# Patient Record
Sex: Female | Born: 1980 | Hispanic: No | Marital: Single | Smoking: Current every day smoker
Health system: Southern US, Community
[De-identification: ages and names within clinical notes are randomized; demographics above are authoritative.]

## PROBLEM LIST (undated history)

## (undated) DIAGNOSIS — K219 Gastro-esophageal reflux disease without esophagitis: Secondary | ICD-10-CM

## (undated) DIAGNOSIS — F329 Major depressive disorder, single episode, unspecified: Secondary | ICD-10-CM

## (undated) DIAGNOSIS — R51 Headache: Secondary | ICD-10-CM

## (undated) DIAGNOSIS — K279 Peptic ulcer, site unspecified, unspecified as acute or chronic, without hemorrhage or perforation: Secondary | ICD-10-CM

## (undated) DIAGNOSIS — F32A Depression, unspecified: Secondary | ICD-10-CM

## (undated) HISTORY — DX: Peptic ulcer, site unspecified, unspecified as acute or chronic, without hemorrhage or perforation: K27.9

## (undated) HISTORY — DX: Major depressive disorder, single episode, unspecified: F32.9

## (undated) HISTORY — DX: Gastro-esophageal reflux disease without esophagitis: K21.9

## (undated) HISTORY — DX: Headache: R51

## (undated) HISTORY — DX: Depression, unspecified: F32.A

---
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
## 2008-04-19 ENCOUNTER — Other Ambulatory Visit: Admission: RE | Admit: 2008-04-19 | Discharge: 2008-04-19 | Payer: Self-pay | Admitting: Gynecology

## 2009-03-19 ENCOUNTER — Emergency Department (HOSPITAL_COMMUNITY): Admission: EM | Admit: 2009-03-19 | Discharge: 2009-03-19 | Payer: Self-pay | Admitting: Family Medicine

## 2009-03-21 ENCOUNTER — Ambulatory Visit: Payer: Self-pay | Admitting: Internal Medicine

## 2009-03-21 DIAGNOSIS — F329 Major depressive disorder, single episode, unspecified: Secondary | ICD-10-CM

## 2009-03-21 DIAGNOSIS — R519 Headache, unspecified: Secondary | ICD-10-CM | POA: Insufficient documentation

## 2009-03-21 DIAGNOSIS — K279 Peptic ulcer, site unspecified, unspecified as acute or chronic, without hemorrhage or perforation: Secondary | ICD-10-CM | POA: Insufficient documentation

## 2009-03-21 DIAGNOSIS — F17200 Nicotine dependence, unspecified, uncomplicated: Secondary | ICD-10-CM | POA: Insufficient documentation

## 2009-03-21 DIAGNOSIS — K219 Gastro-esophageal reflux disease without esophagitis: Secondary | ICD-10-CM

## 2009-03-21 DIAGNOSIS — R51 Headache: Secondary | ICD-10-CM

## 2009-05-07 ENCOUNTER — Telehealth (INDEPENDENT_AMBULATORY_CARE_PROVIDER_SITE_OTHER): Payer: Self-pay | Admitting: *Deleted

## 2009-05-08 ENCOUNTER — Encounter: Payer: Self-pay | Admitting: Internal Medicine

## 2009-05-29 ENCOUNTER — Ambulatory Visit: Payer: Self-pay | Admitting: Internal Medicine

## 2009-06-20 ENCOUNTER — Ambulatory Visit: Payer: Self-pay | Admitting: Internal Medicine

## 2009-09-26 ENCOUNTER — Telehealth: Payer: Self-pay | Admitting: Internal Medicine

## 2009-10-24 ENCOUNTER — Ambulatory Visit: Payer: Self-pay | Admitting: Internal Medicine

## 2009-11-22 ENCOUNTER — Emergency Department (HOSPITAL_COMMUNITY): Admission: EM | Admit: 2009-11-22 | Discharge: 2009-11-22 | Payer: Self-pay | Admitting: Emergency Medicine

## 2009-12-26 ENCOUNTER — Ambulatory Visit: Payer: Self-pay | Admitting: Internal Medicine

## 2009-12-31 ENCOUNTER — Telehealth: Payer: Self-pay | Admitting: Internal Medicine

## 2010-01-01 ENCOUNTER — Ambulatory Visit: Payer: Self-pay | Admitting: Internal Medicine

## 2010-01-02 ENCOUNTER — Telehealth: Payer: Self-pay | Admitting: Internal Medicine

## 2010-01-02 ENCOUNTER — Encounter: Payer: Self-pay | Admitting: Internal Medicine

## 2010-03-12 ENCOUNTER — Telehealth: Payer: Self-pay | Admitting: Internal Medicine

## 2010-04-30 NOTE — Progress Notes (Signed)
Summary: samples.result  Phone Note Outgoing Call Call back at [phone]   Call placed by: Rock Nephew CMA,  January 02, 2010 10:05 AM Summary of Call: LA: please tell her that her breathing was positive for mild asthma. ask her to come in and pick up samples of advair diskus Initial call taken by: Etta Grandchild MD,  January 02, 2010 9:18 AM  Follow-up for Phone Call        Called pt/ left detailed messg on [phone] (ok per consent). Samples placed upfront for pick up.Alvy Beal Archie CMA  January 02, 2010 10:06 AM     New/Updated Medications: ADVAIR DISKUS 100-50 MCG/DOSE AEPB (FLUTICASONE-SALMETEROL) One puff two times a day Prescriptions: ADVAIR DISKUS 100-50 MCG/DOSE AEPB (FLUTICASONE-SALMETEROL) One puff two times a day  #4 inhs x 0   Entered and Authorized by:   Etta Grandchild MD   Signed by:   Etta Grandchild MD on 01/02/2010   Method used:   Samples Given   RxID:   [id number]

## 2010-04-30 NOTE — Miscellaneous (Signed)
Summary: [facility]  [facility]   Imported By: Sherian Rein 05/09/2009 12:30:20  _____________________________________________________________________  External Attachment:    Type:   Image     Comment:   External Document

## 2010-04-30 NOTE — Assessment & Plan Note (Signed)
Summary: FOLLOW UP ON SHORTNESS OF BREATH-LB   Vital Signs:  Patient profile:   30 year old female Menstrual status:  regular Height:      64 inches Weight:      150 pounds BMI:     25.84 O2 Sat:      97 % on Room air Temp:     98.5 degrees F oral Pulse rate:   87 / minute Pulse rhythm:   regular Resp:     16 per minute BP sitting:   98 / 60  (left arm) Cuff size:   large  Vitals Entered By: Rock Nephew CMA (December 26, 2009 8:16 AM)  Nutrition Counseling: Patient's BMI is greater than 25 and therefore counseled on weight management options.  O2 Flow:  Room air CC: Patient c/o SOB w/ cough Is Patient Diabetic? No Pain Assessment Patient in pain? no       Does patient need assistance? Functional Status Self care Ambulation Normal   Primary Care Provider:  Etta Grandchild MD  CC:  Patient c/o SOB w/ cough.  History of Present Illness: She returns c/o persistent non-productive cough with some SOB/DOE. She thinks she may have to stop taking Lexapro due to the cough starting after she started taking Lexapro.  Dyspepsia History:      She has no alarm features of dyspepsia including no history of melena, hematochezia, dysphagia, persistent vomiting, or involuntary weight loss > 5%.  There is a prior history of GERD.  The patient has a prior history of documented ulcer disease.  The dominant symptom is heartburn or acid reflux.  An H-2 blocker medication is currently being taken.  She notes that the symptoms have improved with the H-2 blocker therapy.  Symptoms have not persisted after 4 weeks of H-2 blocker treatment.     Preventive Screening-Counseling & Management  Alcohol-Tobacco     Alcohol drinks/day: <1     Alcohol type: spirits     >5/day in last 3 mos: no     Alcohol Counseling: not indicated; use of alcohol is not excessive or problematic     Feels need to cut down: no     Feels annoyed by complaints: no     Feels guilty re: drinking: no     Needs 'eye  opener' in am: no     Smoking Status: current     Smoking Cessation Counseling: yes     Smoke Cessation Stage: contemplative     Packs/Day: 0.25     Year Started: 1992     Pack years: 10     Tobacco Counseling: to quit use of tobacco products  Hep-HIV-STD-Contraception     Hepatitis Risk: no risk noted     HIV Risk: no risk noted     STD Risk: no risk noted  Medications Prior to Update: 1)  Nuvaring 0.12-0.015 Mg/24hr Ring (Etonogestrel-Ethinyl Estradiol) 2)  Xanax 0.5 Mg Tabs (Alprazolam) .... One By Mouth Three Times A Day As Needed For Anxiety 3)  Lexapro 10 Mg Tabs (Escitalopram Oxalate) .... One By Mouth Once Daily 4)  Dexilant 60 Mg Cpdr (Dexlansoprazole) .... One By Mouth Once Daily  Current Medications (verified): 1)  Nuvaring 0.12-0.015 Mg/24hr Ring (Etonogestrel-Ethinyl Estradiol) 2)  Xanax 0.5 Mg Tabs (Alprazolam) .... One By Mouth Three Times A Day As Needed For Anxiety 3)  Lexapro 10 Mg Tabs (Escitalopram Oxalate) .... One By Mouth Once Daily 4)  Dexilant 60 Mg Cpdr (Dexlansoprazole) .... 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 Family History of Alcoholism/Addiction Family History Breast cancer 1st degree relative <50 Family History Depression Family History Diabetes 1st degree relative Family History High cholesterol Family History Hypertension  Social History: Reviewed history from 03/21/2009 and no changes required. 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  The patient denies anorexia, fever, weight loss, weight gain, chest pain, syncope, dyspnea on exertion, peripheral edema, headaches, hemoptysis, abdominal pain, hematuria, suspicious skin lesions, enlarged lymph nodes, and angioedema.   Resp:  Complains of cough and shortness of breath; denies chest discomfort, chest pain with inspiration, coughing up blood, excessive snoring, hypersomnolence, pleuritic, sputum productive, and wheezing.  Physical Exam  General:  alert, well-developed, well-nourished, well-hydrated, appropriate dress, normal appearance, healthy-appearing, cooperative to examination, and good hygiene.   Mouth:  Oral mucosa and oropharynx without lesions or exudates.  Teeth in good repair. Neck:  supple, full ROM, no masses, no thyromegaly, no JVD, no carotid bruits, and no cervical lymphadenopathy.   Lungs:  normal respiratory effort, no intercostal retractions, no accessory muscle use, normal breath sounds, no dullness, no fremitus, no crackles, and no wheezes.   Heart:  normal rate, regular rhythm, no murmur, no gallop, no rub, and no JVD.   Abdomen:  soft, non-tender, normal bowel sounds, no distention, no masses, no guarding, no rigidity, no hepatomegaly, and no splenomegaly.   Msk:  normal ROM, no joint tenderness, no joint swelling, and  no joint warmth.   Extremities:  No clubbing, cyanosis, edema, or deformity noted with normal full range of motion of all joints.   Skin:  turgor normal, color normal, no rashes, no suspicious lesions, no ecchymoses, no ulcerations, and no edema.  tattoo(s) and vitiligo. Cervical Nodes:  no anterior cervical adenopathy and no posterior cervical adenopathy.   Psych:  Oriented X3, memory intact for recent and remote, normally interactive, good eye contact, not anxious appearing, not depressed appearing, not agitated, and not suicidal.     Impression & Recommendations:  Problem # 1:  COUGH (ICD-786.2) Assessment New will look for structural lesion with chest xray, check PFT's for asthma, stop lexapro, ask her to stop smoking, and follow-up closely Orders: Pulmonary Referral (Pulmonary) T-2 View CXR (71020TC) Tobacco use cessation intermediate 3-10 minutes (10272)  Problem # 2:  TOBACCO USE (ICD-305.1) Assessment: Unchanged  Orders: Tobacco use cessation intermediate 3-10 minutes (53664)  Encouraged smoking cessation and discussed different methods for smoking cessation.   Problem # 3:  GERD (ICD-530.81) Assessment: Improved  Her updated medication list for this problem includes:    Dexilant 60 Mg Cpdr (Dexlansoprazole) .Marland KitchenMarland KitchenMarland KitchenMarland Kitchen  One by mouth once daily  Complete Medication List: 1)  Nuvaring 0.12-0.015 Mg/24hr Ring (Etonogestrel-ethinyl estradiol) 2)  Xanax 0.5 Mg Tabs (Alprazolam) .... One by mouth three times a day as needed for anxiety 3)  Dexilant 60 Mg Cpdr (Dexlansoprazole) .... One by mouth once daily  Patient Instructions: 1)  Please schedule a follow-up appointment in 1 month. 2)  Tobacco is very bad for your health and your loved ones! You Should stop smoking!. 3)  Stop Smoking Tips: Choose a Quit date. Cut down before the Quit date. decide what you will do as a substitute when you feel the urge to smoke(gum,toothpick,exercise).

## 2010-04-30 NOTE — Progress Notes (Signed)
Summary: ABX request  Phone Note Call from Patient Call back at Home Phone [phone]   Summary of Call: Patient left message on triage that she is certain she has a kidney infection and is requesting ABX. Please advise. Initial call taken by: Lucious Groves,  September 26, 2009 1:31 PM  Follow-up for Phone Call        need pharmacy Follow-up by: Etta Grandchild MD,  September 26, 2009 2:13 PM  Additional Follow-up for Phone Call Additional follow up Details #1::        [facility] [address]. Additional Follow-up by: Lucious Groves,  September 26, 2009 2:18 PM    New/Updated Medications: NITROFURANTOIN MONOHYD MACRO 100 MG CAPS (NITROFURANTOIN MONOHYD MACRO) One by mouth two times a day Prescriptions: NITROFURANTOIN MONOHYD MACRO 100 MG CAPS (NITROFURANTOIN MONOHYD MACRO) One by mouth two times a day  #14 x 0   Entered by:   Lucious Groves   Authorized by:   Etta Grandchild MD   Signed by:   Lucious Groves on 09/26/2009   Method used:   Electronically to        [facility] [address] [facility] [phone]* (retail)       [address] [place], [place]  [address]       Ph: [phone]       Fax: [phone]   RxID:   [id number] NITROFURANTOIN MONOHYD MACRO 100 MG CAPS (NITROFURANTOIN MONOHYD MACRO) One by mouth two times a day  #14 x 0   Entered and Authorized by:   Etta Grandchild MD   Signed by:   Etta Grandchild MD on 09/26/2009   Method used:   Historical   RxID:   [id number]

## 2010-04-30 NOTE — Assessment & Plan Note (Signed)
Summary: follow up/rx review/lb   Vital Signs:  Patient profile:   30 year old female Menstrual status:  regular Height:      64 inches Weight:      151 pounds BMI:     26.01 O2 Sat:      98 % on Room air Temp:     98.9 degrees F oral Pulse rate:   92 / minute Pulse rhythm:   regular Resp:     16 per minute BP sitting:   110 / 70  (left arm) Cuff size:   large  Vitals Entered By: Rock Nephew CMA (May 29, 2009 11:08 AM)  O2 Flow:  Room air CC: discuss medication, Depression Is Patient Diabetic? No Pain Assessment Patient in pain? no        Primary Care Provider:  Etta Grandchild MD  CC:  discuss medication and Depression.  History of Present Illness: She returns with a request that she take Lexapro instead of Cymbalta. She has done some research on Cymbalta and she is concerned about side effects and has had a good response to Lexapro before. She is taking 1/2 tab. of Xanax three times a day .  Depression History:      The patient is having a depressed mood most of the day and has a diminished interest in her usual daily activities.  The patient denies symptoms of a manic disorder including persistently & abnormally elevated mood, abnormally & persistently irritable mood, less need for sleep, talkative or feels need to keep talking, distractibility, and flight of ideas.        The patient denies that she feels like life is not worth living, denies that she wishes that she were dead, and denies that she has thought about ending her life.         Depression Treatment History:  Prior Medication Used:   Start Date: Assessment of Effect:   Comments:  Lexapro     01/08/2004   side effects enough to stop   --   Preventive Screening-Counseling & Management  Alcohol-Tobacco     Smoking Cessation Counseling: yes  Current Medications (verified): 1)  Nuvaring 0.12-0.015 Mg/24hr Ring (Etonogestrel-Ethinyl Estradiol) 2)  Cymbalta 30 Mg Cpep (Duloxetine Hcl) .... One By  Mouth Once Daily For Depression 3)  Xanax 0.5 Mg Tabs (Alprazolam) .... One By Mouth Three Times A Day As Needed For Anxiety  Allergies (verified): No Known Drug Allergies  Past History:  Past Medical History: Reviewed history from 03/21/2009 and no changes required. Depression GERD Headache Peptic ulcer disease  Past Surgical History: Reviewed history from 03/21/2009 and no changes required. Cholecystectomy  Family History: Reviewed history from 03/21/2009 and no changes required. Family History of Alcoholism/Addiction Family History Breast cancer 1st degree relative <50 Family History Depression Family History Diabetes 1st degree relative Family History High cholesterol Family History Hypertension  Social History: Reviewed history from 03/21/2009 and no changes required. 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  Physical Exam  General:  alert, well-developed, well-nourished, well-hydrated, appropriate dress, normal appearance, healthy-appearing, cooperative to examination, and good hygiene.   Mouth:  Oral mucosa and oropharynx without lesions or exudates.  Teeth in good repair. Neck:  supple, full ROM, no masses, no thyromegaly, no JVD, no carotid bruits, and no cervical lymphadenopathy.   Lungs:  normal respiratory effort, no intercostal retractions, no accessory muscle use, normal breath sounds, no dullness, no fremitus, no crackles, and no wheezes.   Heart:  normal rate, regular rhythm, no murmur, no gallop, no rub, and no JVD.   Abdomen:  soft, non-tender, normal  bowel sounds, no distention, no masses, no guarding, no rigidity, no hepatomegaly, and no splenomegaly.   Msk:  normal ROM, no joint tenderness, no joint swelling, and no joint warmth.   Neurologic:  No cranial nerve deficits noted. Station and gait are normal. Plantar reflexes are down-going bilaterally. DTRs are symmetrical throughout. Sensory, motor and coordinative functions appear intact. Skin:  turgor normal, color normal, no rashes, no suspicious lesions, no ecchymoses, no ulcerations, and no edema.   Psych:  Oriented X3, memory intact for recent and remote, good eye contact, not agitated, not suicidal, flat affect, and moderately anxious.     Impression & Recommendations:  Problem # 1:  DEPRESSIVE DISORDER (ICD-311) Assessment Unchanged  The following medications were removed from the medication list:    Cymbalta 30 Mg Cpep (Duloxetine hcl) ..... One by mouth once daily for depression Her updated medication list for this problem includes:    Xanax 0.5 Mg Tabs (Alprazolam) ..... One by mouth three times a day as needed for anxiety    Lexapro 10 Mg Tabs (Escitalopram oxalate) ..... One by mouth once daily  Discussed treatment options, including trial of antidpressant medication. Will refer to behavioral health. Follow-up call in in 24-48 hours and recheck in 2 weeks, sooner as needed. Patient agrees to call if any worsening of symptoms or thoughts of doing harm arise. Verified that the patient has no suicidal ideation at this time.   Complete Medication List: 1)  Nuvaring 0.12-0.015 Mg/24hr Ring (Etonogestrel-ethinyl estradiol) 2)  Xanax 0.5 Mg Tabs (Alprazolam) .... One by mouth three times a day as needed for anxiety 3)  Lexapro 10 Mg Tabs (Escitalopram oxalate) .... One by mouth once daily  Patient Instructions: 1)  Please schedule a follow-up appointment in 2 months. 2)  Tobacco is very bad for your health and your loved ones! You Should stop smoking!. 3)  Stop Smoking Tips:  Choose a Quit date. Cut down before the Quit date. decide what you will do as a substitute when you feel the urge to smoke(gum,toothpick,exercise). Prescriptions: LEXAPRO 10 MG TABS (ESCITALOPRAM OXALATE) One by mouth once daily  #70 x 0   Entered and Authorized by:   Etta Grandchild MD   Signed by:   Etta Grandchild MD on 05/29/2009   Method used:   Samples Given   RxID:   [id number]

## 2010-04-30 NOTE — Letter (Signed)
Summary: [facility] Ex[facility]  [facility]   Imported By: Lester Peekskill 05/10/2009 09:33:43  _____________________________________________________________________  External Attachment:    Type:   Image     Comment:   External Document

## 2010-04-30 NOTE — Assessment & Plan Note (Signed)
Summary: ACID REFLUX??--STC   Vital Signs:  Patient profile:   30 year old female Menstrual status:  regular Height:      64 inches Weight:      147.13 pounds BMI:     25.35 O2 Sat:      97 % on Room air Temp:     98.7 degrees F oral Pulse rate:   81 / minute Pulse rhythm:   regular Resp:     16 per minute BP sitting:   98 / 62  (left arm) Cuff size:   large  Vitals Entered By: Rock Nephew CMA (October 24, 2009 2:01 PM)  Nutrition Counseling: Patient's BMI is greater than 25 and therefore counseled on weight management options.  O2 Flow:  Room air CC: acid reflux, Abdominal Pain Is Patient Diabetic? No Pain Assessment Patient in pain? no        Primary Care Provider:  Etta Grandchild MD  CC:  acid reflux and Abdominal Pain.  History of Present Illness: She returns with heartburn and nausea after eating certain spicy foods and she is concerned that Lexapro may have made this worse-but she is doing well on Lexapro and Dr. Evelene Croon does not want to stop lexapro unless she has to. She is not currently taking anything for GERD.  Dyspepsia History:      She has no alarm features of dyspepsia including no history of melena, hematochezia, dysphagia, persistent vomiting, or involuntary weight loss > 5%.  There is a prior history of GERD.  The patient has a prior history of documented ulcer disease.  The dominant symptom is heartburn or acid reflux.  An H-2 blocker medication is not currently being taken.     Preventive Screening-Counseling & Management  Alcohol-Tobacco     Alcohol drinks/day: <1     Alcohol type: spirits     >5/day in last 3 mos: no     Alcohol Counseling: not indicated; use of alcohol is not excessive or problematic     Feels need to cut down: no     Feels annoyed by complaints: no     Feels guilty re: drinking: no     Needs 'eye opener' in am: no     Smoking Status: current     Smoking Cessation Counseling: yes     Smoke Cessation Stage:  precontemplative     Packs/Day: 0.5     Year Started: 1992     Pack years: 10     Tobacco Counseling: to quit use of tobacco products  Hep-HIV-STD-Contraception     Hepatitis Risk: no risk noted     HIV Risk: no risk noted     STD Risk: no risk noted  Medications Prior to Update: 1)  Nuvaring 0.12-0.015 Mg/24hr Ring (Etonogestrel-Ethinyl Estradiol) 2)  Xanax 0.5 Mg Tabs (Alprazolam) .... One By Mouth Three Times A Day As Needed For Anxiety 3)  Lexapro 10 Mg Tabs (Escitalopram Oxalate) .... One By Mouth Once Daily  Current Medications (verified): 1)  Nuvaring 0.12-0.015 Mg/24hr Ring (Etonogestrel-Ethinyl Estradiol) 2)  Xanax 0.5 Mg Tabs (Alprazolam) .... One By Mouth Three Times A Day As Needed For Anxiety 3)  Lexapro 10 Mg Tabs (Escitalopram Oxalate) .... One By Mouth Once Daily 4)  Dexilant 60 Mg Cpdr (Dexlansoprazole) .... 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 Family History of Alcoholism/Addiction Family History Breast cancer 1st degree  relative <50 Family History Depression Family History Diabetes 1st degree relative Family History High cholesterol Family History Hypertension  Social History: Reviewed history from 03/21/2009 and no changes required. 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  The patient denies anorexia, fever, weight loss, weight gain, hoarseness, chest pain, syncope, dyspnea on exertion, peripheral edema, prolonged cough, headaches, hemoptysis, abdominal pain, melena, hematochezia, and hematuria.   Psych:  Complains of anxiety, depression, easily tearful, and irritability; denies alternate hallucination ( auditory/visual), easily angered, panic attacks, sense of great danger, suicidal thoughts/plans, thoughts of violence, unusual visions or sounds, and thoughts /plans of harming others.  Physical Exam  General:  alert, well-developed, well-nourished, well-hydrated, appropriate dress, normal appearance, healthy-appearing, cooperative to examination, and good hygiene.   Mouth:  Oral mucosa and oropharynx without lesions or exudates.  Teeth in good repair. Neck:  supple, full ROM, no masses, no thyromegaly, no JVD, no carotid bruits, and no cervical lymphadenopathy.   Lungs:  normal respiratory effort, no intercostal retractions, no accessory muscle use, normal breath sounds, no dullness, no fremitus, no crackles, and no wheezes.   Heart:  normal rate, regular rhythm, no murmur, no gallop, no rub, and no JVD.   Abdomen:  soft, non-tender, normal bowel sounds, no distention, no masses, no guarding, no rigidity, no hepatomegaly, and no splenomegaly.   Msk:  normal ROM, no joint tenderness, no joint swelling, and no joint warmth.   Extremities:  No clubbing, cyanosis, edema, or deformity noted with normal full range of motion of all  joints.   Neurologic:  No cranial nerve deficits noted. Station and gait are normal. Plantar reflexes are down-going bilaterally. DTRs are symmetrical throughout. Sensory, motor and coordinative functions appear intact. Skin:  turgor normal, color normal, no rashes, no suspicious lesions, no ecchymoses, no ulcerations, and no edema.  tattoo(s) and vitiligo. Cervical Nodes:  no anterior cervical adenopathy and no posterior cervical adenopathy.   Axillary Nodes:  no R axillary adenopathy and no L axillary adenopathy.   Psych:  Oriented X3, memory intact for recent and remote, good eye contact, not agitated, not suicidal, flat affect, and moderately anxious.     Impression & Recommendations:  Problem # 1:  TOBACCO USE (ICD-305.1) Assessment Unchanged  Encouraged smoking cessation and discussed different methods for smoking cessation.   Orders: Tobacco use cessation intermediate 3-10 minutes (16109)  Problem # 2:  GERD (ICD-530.81) Assessment: Deteriorated  Her updated medication list for this problem includes:    Dexilant 60 Mg Cpdr (Dexlansoprazole) ..... One by mouth once daily  Orders: Tobacco use cessation intermediate 3-10 minutes (60454)  Problem # 3:  DEPRESSIVE DISORDER (ICD-311) Assessment: Improved  Her updated medication list for this problem includes:  Xanax 0.5 Mg Tabs (Alprazolam) ..... One by mouth three times a day as needed for anxiety    Lexapro 10 Mg Tabs (Escitalopram oxalate) ..... One by mouth once daily  Discussed treatment options, including trial of antidpressant medication. Will refer to behavioral health. Follow-up call in in 24-48 hours and recheck in 2 weeks, sooner as needed. Patient agrees to call if any worsening of symptoms or thoughts of doing harm arise. Verified that the patient has no suicidal ideation at this time.   Orders: Tobacco use cessation intermediate 3-10 minutes (16109)  Complete Medication List: 1)  Nuvaring 0.12-0.015 Mg/24hr  Ring (Etonogestrel-ethinyl estradiol) 2)  Xanax 0.5 Mg Tabs (Alprazolam) .... One by mouth three times a day as needed for anxiety 3)  Lexapro 10 Mg Tabs (Escitalopram oxalate) .... One by mouth once daily 4)  Dexilant 60 Mg Cpdr (Dexlansoprazole) .... One by mouth once daily  Patient Instructions: 1)  Please schedule a follow-up appointment in 2 months. 2)  Avoid foods high in acid (tomatoes, citrus juices, spicy foods). Avoid eating within two hours of lying down or before exercising. Do not over eat; try smaller more frequent meals. Elevate head of bed twelve inches when sleeping. 3)  Tobacco is very bad for your health and your loved ones! You Should stop smoking!. 4)  Stop Smoking Tips: Choose a Quit date. Cut down before the Quit date. decide what you will do as a substitute when you feel the urge to smoke(gum,toothpick,exercise). Prescriptions: DEXILANT 60 MG CPDR (DEXLANSOPRAZOLE) One by mouth once daily  #50 x 0   Entered and Authorized by:   Etta Grandchild MD   Signed by:   Etta Grandchild MD on 10/24/2009   Method used:   Samples Given   RxID:   [id number]

## 2010-04-30 NOTE — Progress Notes (Signed)
Summary: RESULTS  Phone Note Call from Patient Call back at [phone]   Summary of Call: Patient is requesting results of chest xray.  Initial call taken by: Lamar Sprinkles, CMA,  December 31, 2009 4:12 PM  Follow-up for Phone Call        normal Follow-up by: Etta Grandchild MD,  December 31, 2009 4:18 PM  Additional Follow-up for Phone Call Additional follow up Details #1::        Pt informed  Additional Follow-up by: Lamar Sprinkles, CMA,  December 31, 2009 4:49 PM

## 2010-04-30 NOTE — Progress Notes (Signed)
Summary: Paperwork  Phone Note Call from Patient Call back at Home Phone [phone]   Caller: Patient Call For: Etta Grandchild MD Summary of Call: Patient came into the office with a form for Dr. Yetta Barre to complete for school. Patient states she had a tetanus shot and an MMR vaccine, and she had a reaction to one of them. Her school is requesting that because she doesn't have a complete immunization record that she get two more tetanus shots and another MMR vaccine unless Dr. Yetta Barre completes this form. Without an immunization record or this completed form, they will not allow her to continue at school [facility]).  Initial call taken by: Irma Newness,  May 07, 2009 3:29 PM  Follow-up for Phone Call        the form has nine different categories about multiple other vaccines, do those need to be completed too? this is very confusing Follow-up by: Etta Grandchild MD,  May 07, 2009 4:58 PM  Additional Follow-up for Phone Call Additional follow up Details #1::        The patient says she had a reaction to tetanus and/or MMR (both were given at the same time). Left mess for pt to call office back, to get details of type of reaction................................Marland KitchenLamar Sprinkles, CMA  May 07, 2009 5:08 PM   Spoke with patient. She had MMR and Tetanus on the same day. Hours after she develped a fever, nausea & vomiting. Also had severe arm swelling on the side of the tetanus vaccine and was unable to move her arm x 4 days.  School & the state requires, w/o documentation: 3 tetanus vaccines over 6 mths (0,1 & ) and 2 MMR vaccines. She would like the form completed for Tetanus and MMR excusing her from completing these.  Additional Follow-up by: Lamar Sprinkles, CMA,  May 08, 2009 10:22 AM    Additional Follow-up for Phone Call Additional follow up Details #2::    the form is completed for Tdap and MMR only, the other 7 categories are left unanswered and it will be scanned as  such   Follow-up by: Etta Grandchild MD,  May 08, 2009 10:28 AM  Additional Follow-up for Phone Call Additional follow up Details #3:: Details for Additional Follow-up Action Taken: left a message for patient to pick up completed form. Additional Follow-up by: Daphane Shepherd,  May 08, 2009 1:20 PM

## 2010-04-30 NOTE — Assessment & Plan Note (Signed)
Summary: MEMORY LOSS STC   Vital Signs:  Patient profile:   30 year old female Menstrual status:  regular Height:      64 inches Weight:      148 pounds BMI:     25.50 O2 Sat:      97 % on Room air Temp:     97.8 degrees F oral Pulse rate:   77 / minute Pulse rhythm:   regular Resp:     16 per minute BP sitting:   108 / 62  (left arm) Cuff size:   large  Vitals Entered By: Rock Nephew CMA (June 20, 2009 3:48 PM)  O2 Flow:  Room air  Primary Care Provider:  Etta Grandchild MD   History of Present Illness: She returns complaining that she has had lapses in memory recently. She thinks it may be from taking a higher dose of xanax + lexapro and an increase in her anxiety level.  Preventive Screening-Counseling & Management  Alcohol-Tobacco     Alcohol drinks/day: <1     Alcohol type: spirits     >5/day in last 3 mos: no     Alcohol Counseling: not indicated; use of alcohol is not excessive or problematic     Feels need to cut down: no     Feels annoyed by complaints: no     Feels guilty re: drinking: no     Needs 'eye opener' in am: no     Smoking Status: current     Smoking Cessation Counseling: yes     Smoke Cessation Stage: precontemplative     Packs/Day: 0.5     Year Started: 1992     Pack years: 10  Hep-HIV-STD-Contraception     Hepatitis Risk: no risk noted     HIV Risk: no risk noted     STD Risk: no risk noted      Sexual History:  currently monogamous.        Drug Use:  no.        Blood Transfusions:  no.    Medications Prior to Update: 1)  Nuvaring 0.12-0.015 Mg/24hr Ring (Etonogestrel-Ethinyl Estradiol) 2)  Xanax 0.5 Mg Tabs (Alprazolam) .... One By Mouth Three Times A Day As Needed For Anxiety 3)  Lexapro 10 Mg Tabs (Escitalopram Oxalate) .... One By Mouth Once Daily  Current Medications (verified): 1)  Nuvaring 0.12-0.015 Mg/24hr Ring (Etonogestrel-Ethinyl Estradiol) 2)  Xanax 0.5 Mg Tabs (Alprazolam) .... One By Mouth Three Times A Day As  Needed For Anxiety 3)  Lexapro 10 Mg Tabs (Escitalopram Oxalate) .... One By Mouth Once Daily  Allergies (verified): No Known Drug Allergies  Past History:  Past Medical History: Reviewed history from 03/21/2009 and no changes required. Depression GERD Headache Peptic ulcer disease  Past Surgical History: Reviewed history from 03/21/2009 and no changes required. Cholecystectomy  Family History: Reviewed history from 03/21/2009 and no changes required. Family History of Alcoholism/Addiction Family History Breast cancer 1st degree relative <50 Family History Depression Family History Diabetes 1st degree relative Family History High cholesterol Family History Hypertension  Social History: Reviewed history from 03/21/2009 and no changes required. 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  Physical Exam  General:  alert, well-developed, well-nourished, well-hydrated, appropriate dress, normal appearance, healthy-appearing, cooperative to examination, and good hygiene.   Mouth:  Oral mucosa and oropharynx without lesions or exudates.  Teeth in good repair. Neck:  supple, full ROM, no masses, no thyromegaly, no JVD, no carotid bruits, and no cervical lymphadenopathy.   Lungs:  normal respiratory effort, no intercostal retractions, no accessory muscle use, normal breath sounds, no dullness, no fremitus, no crackles, and no wheezes.   Heart:  normal rate, regular rhythm, no murmur, no gallop, no rub, and no JVD.   Abdomen:  soft, non-tender, normal bowel  sounds, no distention, no masses, no guarding, no rigidity, no hepatomegaly, and no splenomegaly.   Msk:  normal ROM, no joint tenderness, no joint swelling, and no joint warmth.   Neurologic:  No cranial nerve deficits noted. Station and gait are normal. Plantar reflexes are down-going bilaterally. DTRs are symmetrical throughout. Sensory, motor and coordinative functions appear intact. Skin:  turgor normal, color normal, no rashes, no suspicious lesions, no ecchymoses, no ulcerations, and no edema.   Psych:  Oriented X3, memory intact for recent and remote, good eye contact, not agitated, not suicidal, flat affect, and moderately anxious.     Impression & Recommendations:  Problem # 1:  DEPRESSIVE DISORDER (ICD-311) Assessment Deteriorated  Her updated medication list for this problem includes:    Xanax 0.5 Mg Tabs (Alprazolam) ..... One by mouth three times a day as needed for anxiety    Lexapro 10 Mg Tabs (Escitalopram oxalate) ..... One by mouth once daily  Orders: Psychiatric Referral (Psych)  Complete Medication List: 1)  Nuvaring 0.12-0.015 Mg/24hr Ring (Etonogestrel-ethinyl estradiol) 2)  Xanax 0.5 Mg Tabs (Alprazolam) .... One by mouth three times a day as needed for anxiety 3)  Lexapro 10 Mg Tabs (Escitalopram oxalate) .... One by mouth once daily  Patient Instructions: 1)  Please schedule a follow-up appointment in 1 month. 2)  Tobacco is very bad for your health and your loved ones! You Should stop smoking!. 3)  Stop Smoking Tips: Choose a Quit date. Cut down before the Quit date. decide what you will do as a substitute when you feel the urge to smoke(gum,toothpick,exercise). 4)  It is important that you exercise regularly at least 20 minutes 5 times a week. If you develop chest pain, have severe difficulty breathing, or feel very tired , stop exercising immediately and seek medical attention.

## 2010-04-30 NOTE — Miscellaneous (Signed)
Summary: Orders Update pft charges  Clinical Lists Changes  Orders: Added new Service order of Carbon Monoxide diffusing w/capacity (94720) - Signed Added new Service order of Spirometry (Pre & Post) (94060) - Signed Added new Service order of Lung Volumes (94240) - Signed 

## 2010-04-30 NOTE — Progress Notes (Signed)
       New/Updated Medications: VENTOLIN HFA 108 (90 BASE) MCG/ACT AERS (ALBUTEROL SULFATE) 1-2 puffs QID as needed Prescriptions: VENTOLIN HFA 108 (90 BASE) MCG/ACT AERS (ALBUTEROL SULFATE) 1-2 puffs QID as needed  #1 inh x 11   Entered and Authorized by:   Etta Grandchild MD   Signed by:   Etta Grandchild MD on 01/02/2010   Method used:   Electronically to        [facility] [phone]* (retail)       [address] [place], [place]  [address]       Ph: [phone]       Fax: [phone]   RxID:   [id number]

## 2010-05-02 NOTE — Progress Notes (Signed)
Summary: Samples  Phone Note Call from Patient Call back at [phone]   Summary of Call: Patient is requesting samples of lexapro to last until friday at least if possible.  Initial call taken by: Lamar Sprinkles, CMA,  March 12, 2010 1:46 PM  Follow-up for Phone Call        Patient notified samples no longer avail. She would like to know if we have samples of an alternative. Please advise thanks Follow-up by: Rock Nephew CMA,  March 12, 2010 2:17 PM  Additional Follow-up for Phone Call Additional follow up Details #1::        no Additional Follow-up by: Etta Grandchild MD,  March 13, 2010 8:00 AM    Additional Follow-up for Phone Call Additional follow up Details #2::    Left vm for pt on cell # Follow-up by: Lamar Sprinkles, CMA,  March 13, 2010 9:55 AM

## 2010-05-28 ENCOUNTER — Encounter: Payer: Self-pay | Admitting: Internal Medicine

## 2010-05-28 ENCOUNTER — Ambulatory Visit (INDEPENDENT_AMBULATORY_CARE_PROVIDER_SITE_OTHER): Payer: 59 | Admitting: Internal Medicine

## 2010-05-28 DIAGNOSIS — G4730 Sleep apnea, unspecified: Secondary | ICD-10-CM

## 2010-05-28 DIAGNOSIS — K219 Gastro-esophageal reflux disease without esophagitis: Secondary | ICD-10-CM

## 2010-05-28 DIAGNOSIS — F17200 Nicotine dependence, unspecified, uncomplicated: Secondary | ICD-10-CM

## 2010-06-06 NOTE — Assessment & Plan Note (Signed)
Summary: ?breathing issues while sleeping   Vital Signs:  Patient profile:   30 year old female Menstrual status:  regular LMP:     05/14/2010 Height:      64 inches Weight:      156 pounds BMI:     26.87 O2 Sat:      97 % on Room air Temp:     98.6 degrees F oral Pulse rate:   72 / minute Pulse rhythm:   regular Resp:     16 per minute BP sitting:   120 / 70  (left arm) Cuff size:   regular  Vitals Entered By: Rock Nephew CMA (May 28, 2010 8:37 AM)  Nutrition Counseling: Patient's BMI is greater than 25 and therefore counseled on weight management options.  O2 Flow:  Room air CC: Patient c/o shortness of breath during sleep, Abdominal Pain Is Patient Diabetic? No Pain Assessment Patient in pain? no       Does patient need assistance? Functional Status Self care Ambulation Normal LMP (date): 05/14/2010     Enter LMP: 05/14/2010 Last PAP Result normal   Primary Care Provider:  Etta Grandchild MD  CC:  Patient c/o shortness of breath during sleep and Abdominal Pain.  History of Present Illness: She returns c/o a longstanding history of apnea, snoring, and non-restorative sleep.  Dyspepsia History:      She has no alarm features of dyspepsia including no history of melena, hematochezia, dysphagia, persistent vomiting, or involuntary weight loss > 5%.  There is a prior history of GERD.  The patient has a prior history of documented ulcer disease.  The dominant symptom is heartburn or acid reflux.  An H-2 blocker medication is currently being taken.  She notes that the symptoms have improved with the H-2 blocker therapy.  Symptoms have not persisted after 4 weeks of H-2 blocker treatment.     Preventive Screening-Counseling & Management  Alcohol-Tobacco     Alcohol drinks/day: <1     Alcohol type: spirits     >5/day in last 3 mos: no     Alcohol Counseling: not indicated; use of alcohol is not excessive or problematic     Feels need to cut down: no  Feels annoyed by complaints: no     Feels guilty re: drinking: no     Needs 'eye opener' in am: no     Smoking Status: current     Smoking Cessation Counseling: yes     Smoke Cessation Stage: contemplative     Packs/Day: 0.25     Year Started: 1992     Pack years: 10     Tobacco Counseling: to quit use of tobacco products  Hep-HIV-STD-Contraception     Hepatitis Risk: no risk noted     HIV Risk: no risk noted     STD Risk: no risk noted      Sexual History:  currently monogamous.        Drug Use:  no.        Blood Transfusions:  no.    Clinical Review Panels:  Prevention   Last Pap Smear:  normal (09/28/2008)  Immunizations   Last Tetanus Booster:  Tdap (03/31/2008)   Medications Prior to Update: 1)  Nuvaring 0.12-0.015 Mg/24hr Ring (Etonogestrel-Ethinyl Estradiol) 2)  Xanax 0.5 Mg Tabs (Alprazolam) .... One By Mouth Three Times A Day As Needed For Anxiety 3)  Dexilant 60 Mg Cpdr (Dexlansoprazole) .... One By Mouth Once Daily 4)  Advair Diskus 100-50  Mcg/dose Aepb (Fluticasone-Salmeterol) .... One Puff Two Times A Day 5)  Ventolin Hfa 108 (90 Base) Mcg/act Aers (Albuterol Sulfate) .Marland Kitchen.. 1-2 Puffs Qid As Needed  Current Medications (verified): 1)  Nuvaring 0.12-0.015 Mg/24hr Ring (Etonogestrel-Ethinyl Estradiol) 2)  Xanax 0.5 Mg Tabs (Alprazolam) .... One By Mouth Three Times A Day As Needed For Anxiety 3)  Advair Diskus 100-50 Mcg/dose Aepb (Fluticasone-Salmeterol) .... One Puff Two Times A Day 4)  Ventolin Hfa 108 (90 Base) Mcg/act Aers (Albuterol Sulfate) .Marland Kitchen.. 1-2 Puffs Qid As Needed 5)  Lexapro 10 Mg Tabs (Escitalopram Oxalate) .Marland Kitchen.. 1 and 1/2 By Mouth Once Daily 6)  Nexium 40 Mg Cpdr (Esomeprazole Magnesium) .... 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 Family History of Alcoholism/Addiction Family History Breast cancer 1st degree relative <50 Family History Depression Family History Diabetes 1st degree relative Family History High cholesterol Family History Hypertension  Social History: Reviewed history from 03/21/2009 and no changes required. 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  The patient denies anorexia, fever, weight loss, chest pain, syncope, dyspnea on exertion, peripheral edema, prolonged cough, headaches, hemoptysis, abdominal pain, hematuria, muscle weakness, suspicious skin lesions, enlarged lymph nodes, and angioedema.   Resp:  Complains of hypersomnolence and morning headaches; denies chest discomfort, chest pain with inspiration, cough, coughing up blood, excessive snoring, pleuritic, shortness of breath, sputum productive, and wheezing. Psych:  Denies anxiety, depression, easily angered, easily tearful, irritability, mental problems, panic attacks, sense of great danger, suicidal thoughts/plans, thoughts of violence, and unusual visions or  sounds.  Physical Exam  General:  alert, well-developed, well-nourished, well-hydrated, appropriate dress, normal appearance, healthy-appearing, cooperative to examination, and good hygiene.   Head:  normocephalic and atraumatic.   Mouth:  Oral mucosa and oropharynx without lesions or exudates.  Teeth in good repair. Neck:  supple, full ROM, no masses, no thyromegaly, no JVD, no carotid bruits, and no cervical lymphadenopathy.   Lungs:  normal respiratory effort, no intercostal retractions, no accessory muscle use, normal breath sounds, no dullness, no fremitus, no crackles, and no wheezes.   Heart:  normal rate, regular rhythm, no murmur, no gallop, no rub, and no JVD.   Abdomen:  soft, non-tender, normal bowel sounds, no distention, no masses, no guarding, no rigidity, no hepatomegaly, and no splenomegaly.   Msk:  normal ROM, no joint tenderness, no joint swelling, and no joint warmth.   Extremities:  No clubbing, cyanosis, edema, or deformity noted with normal full range of motion of all joints.   Neurologic:  No cranial nerve deficits noted. Station and gait are normal. Plantar reflexes are down-going bilaterally. DTRs  are symmetrical throughout. Sensory, motor and coordinative functions appear intact. Skin:  turgor normal, color normal, no rashes, no suspicious lesions, no ecchymoses, no ulcerations, and no edema.  tattoo(s) and vitiligo. Cervical Nodes:  no anterior cervical adenopathy and no posterior cervical adenopathy.   Axillary Nodes:  no R axillary adenopathy and no L axillary adenopathy.   Psych:  Oriented X3, memory intact for recent and remote, normally interactive, good eye contact, not anxious appearing, not depressed appearing, not agitated, and not suicidal.     Impression & Recommendations:  Problem # 1:  SLEEP APNEA (ICD-780.57) Assessment New  Orders: Sleep Disorder Referral (Sleep Disorder)  Problem # 2:  TOBACCO USE (ICD-305.1) Assessment: Unchanged  Encouraged  smoking cessation and discussed different methods for smoking cessation.   Problem # 3:  GERD (ICD-530.81) Assessment: Unchanged  The following medications were removed from the medication list:    Dexilant 60 Mg Cpdr (Dexlansoprazole) ..... One by mouth once daily Her updated medication list for this problem includes:    Nexium 40 Mg Cpdr (Esomeprazole magnesium) ..... One by mouth qd  Complete Medication List: 1)  Nuvaring 0.12-0.015 Mg/24hr Ring (Etonogestrel-ethinyl estradiol) 2)  Xanax 0.5 Mg Tabs (Alprazolam) .... One by mouth three times a day as needed for anxiety 3)  Advair Diskus 100-50 Mcg/dose Aepb (Fluticasone-salmeterol) .... One puff two times a day 4)  Ventolin Hfa 108 (90 Base) Mcg/act Aers (Albuterol sulfate) .Marland Kitchen.. 1-2 puffs qid as needed 5)  Lexapro 10 Mg Tabs (Escitalopram oxalate) .Marland Kitchen.. 1 and 1/2 by mouth once daily 6)  Nexium 40 Mg Cpdr (Esomeprazole magnesium) .... One by mouth qd  Patient Instructions: 1)  Please schedule a follow-up appointment in 2 months. 2)  Tobacco is very bad for your health and your loved ones! You Should stop smoking!. 3)  Stop Smoking Tips: Choose a Quit date. Cut down before the Quit date. decide what you will do as a substitute when you feel the urge to smoke(gum,toothpick,exercise). 4)  It is important that you exercise regularly at least 20 minutes 5 times a week. If you develop chest pain, have severe difficulty breathing, or feel very tired , stop exercising immediately and seek medical attention. Prescriptions: NEXIUM 40 MG CPDR (ESOMEPRAZOLE MAGNESIUM) one by mouth qd  #50 x 0   Entered and Authorized by:   Etta Grandchild MD   Signed by:   Etta Grandchild MD on 05/28/2010   Method used:   Samples Given   RxID:   [id number]    Orders Added: 1)  Sleep Disorder Referral [Sleep Disorder] 2)  Est. Patient Level IV [84696]

## 2010-06-12 ENCOUNTER — Encounter: Payer: Self-pay | Admitting: *Deleted

## 2010-06-20 ENCOUNTER — Other Ambulatory Visit: Payer: Self-pay | Admitting: Internal Medicine

## 2010-06-20 NOTE — Telephone Encounter (Signed)
Request for Alprazolam 0.5mg  Sig: Take (1) tablet PO TID PRN for Anxiety  #90 x 2

## 2010-06-21 ENCOUNTER — Telehealth: Payer: Self-pay | Admitting: Internal Medicine

## 2010-06-21 NOTE — Telephone Encounter (Signed)
Alprazolam 0.5mg  Tablet #90x2

## 2010-06-21 NOTE — Telephone Encounter (Signed)
Ok to refill 

## 2010-06-24 ENCOUNTER — Encounter: Payer: Self-pay | Admitting: Pulmonary Disease

## 2010-06-24 ENCOUNTER — Other Ambulatory Visit: Payer: Self-pay

## 2010-06-24 ENCOUNTER — Ambulatory Visit (INDEPENDENT_AMBULATORY_CARE_PROVIDER_SITE_OTHER): Payer: 59 | Admitting: Pulmonary Disease

## 2010-06-24 VITALS — BP 112/76 | HR 83 | Temp 98.5°F | Ht 64.0 in | Wt 154.4 lb

## 2010-06-24 DIAGNOSIS — G4730 Sleep apnea, unspecified: Secondary | ICD-10-CM

## 2010-06-24 MED ORDER — ALPRAZOLAM 0.5 MG PO TABS: 0.5000 mg | ORAL_TABLET | Freq: Three times a day (TID) | ORAL | Status: DC | PRN

## 2010-06-24 NOTE — Progress Notes (Signed)
  Subjective:    Patient ID: Christina Salazar, female    DOB: 07/27/1980, 30 y.o.   MRN: [id number]  HPI The pt comes in today for evaluation of possible sleep apnea.  She has been noted to have pauses in her breathing during sleep, and most recently the pt has been having gasping episodes that awaken her from sleep.  However, she states that no one has commented on her having snoring.  The pt also has a h/o reflux disease, and notes cough during the night.  She has not stayed compliant on her PPI.  She has very frequent awakenings during the night, most of which she feels are due to her pauses/gasps.  She has nonrestorative sleep, but denies inappropriate daytime sleepiness.  Her epworth today is only 2.  She does have leg kicks during the night, and her bed covers are disheveled in the am's.  She denies symptoms of RLS.    Sleep Questionnaire: What time do you typically go to bed?( Between what hours) 12am -1am How long does it take you to fall asleep? - 1 hr How many times during the night do you wake up? 20 What time do you get out of bed to start your day? 0800 Do you drive or operate heavy machinery in your occupation? No How much has your weight changed (up or down) over the past two years? (In pounds) 10 lb (4.536 kg) Have you ever had a sleep study before? No Do you currently use CPAP? No Do you wear oxygen at any time? No    Review of Systems  Constitutional: Positive for unexpected weight change. Negative for fever and appetite change.  HENT: Negative for ear pain, congestion, rhinorrhea, sneezing and postnasal drip.   Eyes: Negative for redness.  Respiratory: Positive for cough and shortness of breath. Negative for wheezing.   Cardiovascular: Positive for chest pain and palpitations. Negative for leg swelling.  Gastrointestinal: Positive for nausea and diarrhea. Negative for abdominal pain.  Genitourinary: Negative for dysuria.  Musculoskeletal: Negative for joint swelling.  Skin:  Negative for rash.  Neurological: Positive for syncope and headaches.  Hematological: Bruises/bleeds easily.  Psychiatric/Behavioral: Negative for dysphoric mood. The patient is nervous/anxious.        Objective:   Physical Exam Constitutional:  Well developed, no acute distress  HENT:  Nares patent without discharge, but mildly narrowed.  Oropharynx without exudate, palate and uvula are mildly elongated.  Eyes:  Perrla, eomi, no scleral icterus  Neck:  No JVD, no TMG  Cardiovascular:  Normal rate, regular rhythm, no rubs or gallops.  No murmurs        Intact distal pulses  Pulmonary :  Normal breath sounds, no stridor or respiratory distress   No rales, rhonchi, or wheezing  Abdominal:  Soft, nondistended, bowel sounds present.  No tenderness noted.   Musculoskeletal:  No lower extremity edema noted.  Lymph Nodes:  No cervical lymphadenopathy noted  Skin:  No cyanosis noted  Neurologic:  Alert, appropriate, moves all 4 extremities without obvious deficit.         Assessment & Plan:

## 2010-06-24 NOTE — Assessment & Plan Note (Signed)
The pt's history is suggestive of something fragmenting her sleep at night.  This may be central or obstructive sleep apnea, PLMS, or possibly reflux leading to nocturnal cough and laryngospasm.  She will obviously need a sleep center study to evaluate for these entities, and the pt is agreeable.  Will see her back when results are available.

## 2010-06-24 NOTE — Patient Instructions (Signed)
Will schedule for sleep study, and arrange followup when results are available Get back on your med for reflux

## 2010-06-24 NOTE — Telephone Encounter (Signed)
Received fax from [facility] [address] requesting a med refill for pt alprazolam 0.5mg . Thanks

## 2010-06-24 NOTE — Telephone Encounter (Signed)
ok 

## 2010-06-24 NOTE — Telephone Encounter (Signed)
RX called in to pharmacy on file

## 2010-06-25 NOTE — Telephone Encounter (Signed)
Documented as phoned-in on 06/24/2010.

## 2010-07-01 LAB — DIFFERENTIAL
Basophils Absolute: 0 10*3/uL (ref 0.0–0.1)
Basophils Relative: 1 % (ref 0–1)
Lymphs Abs: 1.9 10*3/uL (ref 0.7–4.0)
Neutrophils Relative %: 73 % (ref 43–77)

## 2010-07-01 LAB — CBC
HCT: 42.7 % (ref 36.0–46.0)
MCV: 82.2 fL (ref 78.0–100.0)
RBC: 5.2 MIL/uL — ABNORMAL HIGH (ref 3.87–5.11)

## 2010-07-01 LAB — POCT PREGNANCY, URINE: Preg Test, Ur: NEGATIVE

## 2010-07-25 ENCOUNTER — Ambulatory Visit (HOSPITAL_BASED_OUTPATIENT_CLINIC_OR_DEPARTMENT_OTHER): Payer: 59 | Attending: Pulmonary Disease

## 2010-07-25 DIAGNOSIS — G4710 Hypersomnia, unspecified: Secondary | ICD-10-CM | POA: Insufficient documentation

## 2010-08-08 DIAGNOSIS — G4710 Hypersomnia, unspecified: Secondary | ICD-10-CM

## 2010-08-08 DIAGNOSIS — G4730 Sleep apnea, unspecified: Secondary | ICD-10-CM

## 2010-08-08 NOTE — Procedures (Signed)
NAMEMarland Kitchen  Christina Salazar, Christina Salazar NO.:  [id number]  MEDICAL RECORD NO.:  [id number]          PATIENT TYPE:  OUT  LOCATION:  SLEEP CENTER                 FACILITY:  [facility]  PHYSICIAN:  Barbaraann Share, MD,FCCPDATE OF BIRTH:  04-18-1978  DATE OF STUDY:  07/25/2010                           NOCTURNAL POLYSOMNOGRAM  REFERRING PHYSICIAN:  Natausha Jungwirth M Kasia Trego  INDICATION FOR STUDY:  Hypersomnia with sleep apnea.  EPWORTH SLEEPINESS SCORE:  3.  MEDICATIONS:  SLEEP ARCHITECTURE:  The patient had a total sleep time of 261 minutes with decreased slow wave sleep and only 14 minutes of REM.  Sleep onset latency was prolonged at 72 minutes and REM onset was prolonged at 212 minutes.  Sleep efficiency was poor at 68%.  RESPIRATORY DATA:  The patient was found to have no obstructive apneas or hypopneas, giving her a normal apnea/hypopnea index.  She was noted to have mild intermittent snoring.  OXYGEN DATA:  There was O2 desaturation transiently as low as 93% and spontaneous in nature.  CARDIAC DATA:  Rare PVC noted, but no clinically significant arrhythmias were seen.  MOVEMENT-PARASOMNIA:  The patient had no abnormal leg jerks or other abnormal behaviors noted.  IMPRESSIONS-RECOMMENDATIONS: 1. The patient was found to have no obstructive apneas or hypopneas     during the night.  She did have very little REM and slow wave     sleep, however, had more than ample opportunity to exhibit     clinically significant sleep apnea. 2. Rare premature ventricular contraction noted, but no clinically     significant arrhythmias were seen.     Barbaraann Share, MD,FCCP Diplomate, American Board of Sleep Medicine Electronically Signed   [facility]/MED[facility]  D:  08/08/2010 08:28:36  T:  08/08/2010 60:45:40  Job:  [id number]

## 2010-08-13 ENCOUNTER — Telehealth: Payer: Self-pay | Admitting: Pulmonary Disease

## 2010-08-13 ENCOUNTER — Encounter: Payer: Self-pay | Admitting: Pulmonary Disease

## 2010-08-13 NOTE — Telephone Encounter (Signed)
Megan, please call pt and let her know her sleep study shows no sleep apnea, and only light snoring.  We saw no other abnormalities that would disrupt her sleep.  I wonder if reflux is the issue, and she should stay on meds for awhile to see if improves.

## 2010-08-13 NOTE — Progress Notes (Signed)
Pt's npsg did not show any apnea or hypopneas, and only mild intermittant snoring. No PLMs noted.  The pt does not have osa, and I wonder if reflux is contributing to her sleep disruption.  Have tried to call her with results, and let message on cell to call her.

## 2010-08-19 NOTE — Telephone Encounter (Signed)
[id number] 

## 2010-08-21 NOTE — Telephone Encounter (Signed)
[id number] 

## 2010-08-21 NOTE — Telephone Encounter (Signed)
Called and spoke with pt. Informed her of sleep study results and KC's recs.  Pt verbalized understanding and will take Nexium to see if that helps symptoms.

## 2010-09-19 ENCOUNTER — Encounter: Payer: Self-pay | Admitting: Pulmonary Disease

## 2010-10-04 ENCOUNTER — Encounter: Payer: Self-pay | Admitting: Pulmonary Disease

## 2012-12-07 ENCOUNTER — Other Ambulatory Visit: Payer: Self-pay | Admitting: Gynecology

## 2012-12-07 DIAGNOSIS — Z803 Family history of malignant neoplasm of breast: Secondary | ICD-10-CM

## 2013-01-17 ENCOUNTER — Telehealth: Payer: Self-pay | Admitting: Internal Medicine

## 2013-01-17 NOTE — Telephone Encounter (Signed)
Christina Salazar was a patient a couple of years ago.  She moved to [place] and saw doctors there.  She has moved back and wants to know if Dr. Yetta Barre will take her back as a patient.  She has [employer].

## 2013-01-17 NOTE — Telephone Encounter (Signed)
yes

## 2013-01-18 NOTE — Telephone Encounter (Signed)
LMOM to return call.

## 2013-01-19 NOTE — Telephone Encounter (Signed)
LMOM to return call.

## 2013-01-20 NOTE — Telephone Encounter (Signed)
Pt has appt wit Dr Yetta Barre 02/04/13.

## 2013-02-04 ENCOUNTER — Ambulatory Visit: Payer: 59 | Admitting: Internal Medicine

## 2013-04-11 ENCOUNTER — Emergency Department (HOSPITAL_COMMUNITY): Payer: BC Managed Care – PPO

## 2013-04-11 ENCOUNTER — Emergency Department (HOSPITAL_COMMUNITY)
Admission: EM | Admit: 2013-04-11 | Discharge: 2013-04-11 | Disposition: A | Discharge status: Home / Self Care | Payer: BC Managed Care – PPO | Attending: Emergency Medicine | Admitting: Emergency Medicine

## 2013-04-11 ENCOUNTER — Encounter (HOSPITAL_COMMUNITY): Payer: Self-pay | Admitting: Emergency Medicine

## 2013-04-11 DIAGNOSIS — Z8711 Personal history of peptic ulcer disease: Secondary | ICD-10-CM | POA: Insufficient documentation

## 2013-04-11 DIAGNOSIS — N939 Abnormal uterine and vaginal bleeding, unspecified: Secondary | ICD-10-CM

## 2013-04-11 DIAGNOSIS — D259 Leiomyoma of uterus, unspecified: Secondary | ICD-10-CM

## 2013-04-11 DIAGNOSIS — Z3202 Encounter for pregnancy test, result negative: Secondary | ICD-10-CM | POA: Insufficient documentation

## 2013-04-11 DIAGNOSIS — F3289 Other specified depressive episodes: Secondary | ICD-10-CM | POA: Insufficient documentation

## 2013-04-11 DIAGNOSIS — K219 Gastro-esophageal reflux disease without esophagitis: Secondary | ICD-10-CM | POA: Insufficient documentation

## 2013-04-11 DIAGNOSIS — N898 Other specified noninflammatory disorders of vagina: Secondary | ICD-10-CM | POA: Insufficient documentation

## 2013-04-11 DIAGNOSIS — N949 Unspecified condition associated with female genital organs and menstrual cycle: Secondary | ICD-10-CM | POA: Insufficient documentation

## 2013-04-11 DIAGNOSIS — F17200 Nicotine dependence, unspecified, uncomplicated: Secondary | ICD-10-CM | POA: Insufficient documentation

## 2013-04-11 DIAGNOSIS — Z79899 Other long term (current) drug therapy: Secondary | ICD-10-CM | POA: Insufficient documentation

## 2013-04-11 DIAGNOSIS — F329 Major depressive disorder, single episode, unspecified: Secondary | ICD-10-CM | POA: Insufficient documentation

## 2013-04-11 LAB — URINE MICROSCOPIC-ADD ON

## 2013-04-11 LAB — CBC WITH DIFFERENTIAL/PLATELET
BASOS PCT: 0 % (ref 0–1)
Basophils Absolute: 0 10*3/uL (ref 0.0–0.1)
Eosinophils Absolute: 0.4 10*3/uL (ref 0.0–0.7)
Eosinophils Relative: 4 % (ref 0–5)
HEMATOCRIT: 39.2 % (ref 36.0–46.0)
HEMOGLOBIN: 13.2 g/dL (ref 12.0–15.0)
Lymphocytes Relative: 35 % (ref 12–46)
Lymphs Abs: 4.2 10*3/uL — ABNORMAL HIGH (ref 0.7–4.0)
MCH: 26.7 pg (ref 26.0–34.0)
MCHC: 33.7 g/dL (ref 30.0–36.0)
MCV: 79.2 fL (ref 78.0–100.0)
Monocytes Absolute: 0.8 10*3/uL (ref 0.1–1.0)
Monocytes Relative: 6 % (ref 3–12)
Neutro Abs: 6.7 10*3/uL (ref 1.7–7.7)
Neutrophils Relative %: 55 % (ref 43–77)
Platelets: 262 10*3/uL (ref 150–400)
RBC: 4.95 MIL/uL (ref 3.87–5.11)
RDW: 13.9 % (ref 11.5–15.5)
WBC: 12.2 10*3/uL — AB (ref 4.0–10.5)

## 2013-04-11 LAB — COMPREHENSIVE METABOLIC PANEL
ALBUMIN: 4.4 g/dL (ref 3.5–5.2)
ALK PHOS: 82 U/L (ref 39–117)
ALT: 10 U/L (ref 0–35)
AST: 18 U/L (ref 0–37)
BUN: 17 mg/dL (ref 6–23)
CHLORIDE: 97 meq/L (ref 96–112)
CO2: 23 meq/L (ref 19–32)
CREATININE: 0.73 mg/dL (ref 0.50–1.10)
Calcium: 9.3 mg/dL (ref 8.4–10.5)
GFR calc Af Amer: >90 mL/min (ref >90)
GFR calc non Af Amer: >90 mL/min (ref >90)
GLUCOSE: 83 mg/dL (ref 70–99)
Potassium: 4 mEq/L (ref 3.7–5.3)
Sodium: 134 mEq/L — ABNORMAL LOW (ref 137–147)
Total Protein: 7.8 g/dL (ref 6.0–8.3)

## 2013-04-11 LAB — WET PREP, GENITAL
Clue Cells Wet Prep HPF POC: NONE SEEN
TRICH WET PREP: NONE SEEN
YEAST WET PREP: NONE SEEN

## 2013-04-11 LAB — URINALYSIS, ROUTINE W REFLEX MICROSCOPIC
BILIRUBIN URINE: NEGATIVE
GLUCOSE, UA: NEGATIVE mg/dL
KETONES UR: 40 mg/dL — AB
Leukocytes, UA: NEGATIVE
Nitrite: NEGATIVE
PROTEIN: NEGATIVE mg/dL
SPECIFIC GRAVITY, URINE: 1.024 (ref 1.005–1.030)
UROBILINOGEN UA: 0.2 mg/dL (ref 0.0–1.0)
pH: 5 (ref 5.0–8.0)

## 2013-04-11 LAB — GC/CHLAMYDIA PROBE AMP
CT Probe RNA: NEGATIVE
GC PROBE AMP APTIMA: NEGATIVE

## 2013-04-11 LAB — LIPASE, BLOOD: Lipase: 57 U/L (ref 11–59)

## 2013-04-11 LAB — POCT PREGNANCY, URINE: PREG TEST UR: NEGATIVE

## 2013-04-11 MED ORDER — IBUPROFEN 800 MG PO TABS: 800.0000 mg | ORAL_TABLET | Freq: Three times a day (TID) | ORAL | Status: DC

## 2013-04-11 MED ORDER — IBUPROFEN 800 MG PO TABS
800.0000 mg | ORAL_TABLET | Freq: Once | ORAL | Status: AC
  Administered 2013-04-11: 800 mg via ORAL
  Filled 2013-04-11: qty 1

## 2013-04-11 MED ORDER — TRAMADOL HCL 50 MG PO TABS: 50.0000 mg | ORAL_TABLET | Freq: Four times a day (QID) | ORAL | Status: DC | PRN

## 2013-04-11 NOTE — ED Notes (Signed)
Pt. reports vaginal spotting / vaginal drainage with mid abdominal pain onset 3 weeks ago after IUD insertion .

## 2013-04-11 NOTE — ED Notes (Signed)
Pt transported to ultrasound.

## 2013-04-11 NOTE — ED Notes (Addendum)
Report received, assumed care.  

## 2013-04-11 NOTE — ED Provider Notes (Signed)
CSN: [id number]     Arrival date & time 04/11/13  0030 History   First MD Initiated Contact with Patient 04/11/13 0445     Chief Complaint  Patient presents with  . Vaginal Bleeding   (Consider location/radiation/quality/duration/timing/severity/associated sxs/prior Treatment) HPI History per patient. Had IUD placed about 3 weeks ago and ever since that time is having abdominal pain and cramping. She has minimal vaginal spotting unchanged. She has been taking Motrin 800 mg for pain which does help some. She has associated low back pain. No dysuria, urgency or frequency. No upper abdominal pain. Pain does not lateralize. She denies any rash or vaginal discharge. Symptoms moderate in severity. PT requesting motrin.   Past Medical History  Diagnosis Date  . Depression   . GERD (gastroesophageal reflux disease)   . Headache(784.0)   . Peptic ulcer disease    Past Surgical History  Procedure Laterality Date  . Cholecystectomy  1999   Family History  Problem Relation Age of Onset  . Alcohol abuse      Family hx  . Breast cancer      1st degree relative  . Depression      Family Hx  . Diabetes      1st degree relative  . Hyperlipidemia    . Hypertension    . Emphysema Paternal Grandmother   . Asthma Sister   . Asthma Brother   . Heart disease Paternal Grandmother   . Heart disease Father   . Clotting disorder Mother   . Breast cancer Mother   . Liver cancer Father   . Lung cancer Paternal Grandmother   . Liver cancer Paternal Grandfather    History  Substance Use Topics  . Smoking status: Current Every Day Smoker -- 0.50 packs/day  . Smokeless tobacco: Not on file     Comment: started at age 61.  . Alcohol Use: Yes   OB History   Grav Para Term Preterm Abortions TAB SAB Ect Mult Living                 Review of Systems  Constitutional: Negative for fever and chills.  Respiratory: Negative for shortness of breath.   Cardiovascular: Negative for chest pain.   Gastrointestinal: Negative for abdominal pain.  Genitourinary: Positive for vaginal bleeding and pelvic pain. Negative for dysuria.  Musculoskeletal: Negative for neck pain.  Skin: Negative for rash.  Neurological: Negative for headaches.  All other systems reviewed and are negative.    Allergies  Shellfish-derived products  Home Medications   Current Outpatient Rx  Name  Route  Sig  Dispense  Refill  . albuterol (VENTOLIN HFA) 108 (90 BASE) MCG/ACT inhaler   Inhalation   Inhale 2 puffs into the lungs every 6 (six) hours as needed.           . ALPRAZolam (XANAX) 0.5 MG tablet   Oral   Take 1 tablet (0.5 mg total) by mouth 3 (three) times daily as needed for sleep or anxiety. Take one by mouth three times a day as needed for anxiety   90 tablet   5   . escitalopram (LEXAPRO) 10 MG tablet      Take 1 and 1/2 tablet once daily          . esomeprazole (NEXIUM) 40 MG capsule   Oral   Take 40 mg by mouth daily before breakfast.           . etonogestrel-ethinyl estradiol (NUVARING) 0.12-0.015 MG/24HR vaginal ring  Vaginal   Place 1 each vaginally every 28 (twenty-eight) days. Insert vaginally and leave in place for 3 consecutive weeks, then remove for 1 week.           BP 121/73  Pulse 91  Temp(Src) 98.1 F (36.7 C) (Oral)  Resp 18  Ht 5\' 4"  (1.626 m)  Wt 174 lb 4 oz (79.039 kg)  BMI 29.90 kg/m2  SpO2 100%  LMP 04/04/2013 Physical Exam  Constitutional: She is oriented to person, place, and time. She appears well-developed and well-nourished.  HENT:  Head: Normocephalic and atraumatic.  Eyes: EOM are normal. Pupils are equal, round, and reactive to light.  Neck: Neck supple.  Cardiovascular: Normal rate, regular rhythm and intact distal pulses.   Pulmonary/Chest: Effort normal and breath sounds normal. No respiratory distress.  Abdominal: Soft. Bowel sounds are normal. She exhibits no distension. There is no guarding.  Some suprapubic tenderness but no  right lower quadrant tenderness and no acute abdomen. No CVA tenderness.  Genitourinary: Vagina normal and uterus normal.  Moderate dark vaginal discharge with no adnexal tenderness. No cervical motion tenderness. No vaginal bleeding  Musculoskeletal: Normal range of motion. She exhibits no edema.  Neurological: She is alert and oriented to person, place, and time.  Skin: Skin is warm and dry.    ED Course  Procedures (including critical care time) Labs Review Labs Reviewed  WET PREP, GENITAL - Abnormal; Notable for the following:    WBC, Wet Prep HPF POC FEW (*)    All other components within normal limits  CBC WITH DIFFERENTIAL - Abnormal; Notable for the following:    WBC 12.2 (*)    Lymphs Abs 4.2 (*)    All other components within normal limits  COMPREHENSIVE METABOLIC PANEL - Abnormal; Notable for the following:    Sodium 134 (*)    Total Bilirubin <0.2 (*)    All other components within normal limits  URINALYSIS, ROUTINE W REFLEX MICROSCOPIC - Abnormal; Notable for the following:    Hgb urine dipstick TRACE (*)    Ketones, ur 40 (*)    All other components within normal limits  URINE MICROSCOPIC-ADD ON - Abnormal; Notable for the following:    Squamous Epithelial / LPF FEW (*)    Bacteria, UA FEW (*)    All other components within normal limits  GC/CHLAMYDIA PROBE AMP  LIPASE, BLOOD  POCT PREGNANCY, URINE   Imaging Review US Transvaginal Non-ob  04/11/2013   CLINICAL DATA:  Pelvic pain.  EXAM: TRANSABDOMINAL ULTRASOUND OF PELVIS  TECHNIQUE: Transabdominal ultrasound examination of the pelvis was performed including evaluation of the uterus, ovaries, adnexal regions, and pelvic cul-de-sac.  COMPARISON:  None.  FINDINGS: Uterus  Measurements: 8 x 4 x 5 cm. There is a IUD, with crossbar within 5 mm of the upper fundic endometrial canal. No evidence of myometrial perforation. There is a submucosal fibroid in the anterior body, 1.6 x 1.2 x 1.5 cm. The fibroid has typical  heterogeneous echotexture with shadowing.  Endometrium  Thickness: 7 mm.  IUD as above.  Right ovary  Measurements: 4.4 x 3.3 x 2.8 cm. Size differential related to a 2.5 cm cyst/ follicle which appears simple.  Left ovary  Measurements: 2.8 x 2.3 x 2 cm. Normal appearance/no adnexal mass.  IMPRESSION: 1. No acute pelvic findings. 2. IUD in unremarkable position. 3. 1.5 cm submucosal fibroid   Electronically Signed   By: Jorje Guild M.D.   On: 04/11/2013 07:01   US Pelvis  Complete  04/11/2013   CLINICAL DATA:  Pelvic pain.  EXAM: TRANSABDOMINAL ULTRASOUND OF PELVIS  TECHNIQUE: Transabdominal ultrasound examination of the pelvis was performed including evaluation of the uterus, ovaries, adnexal regions, and pelvic cul-de-sac.  COMPARISON:  None.  FINDINGS: Uterus  Measurements: 8 x 4 x 5 cm. There is a IUD, with crossbar within 5 mm of the upper fundic endometrial canal. No evidence of myometrial perforation. There is a submucosal fibroid in the anterior body, 1.6 x 1.2 x 1.5 cm. The fibroid has typical heterogeneous echotexture with shadowing.  Endometrium  Thickness: 7 mm.  IUD as above.  Right ovary  Measurements: 4.4 x 3.3 x 2.8 cm. Size differential related to a 2.5 cm cyst/ follicle which appears simple.  Left ovary  Measurements: 2.8 x 2.3 x 2 cm. Normal appearance/no adnexal mass.  IMPRESSION: 1. No acute pelvic findings. 2. IUD in unremarkable position. 3. 1.5 cm submucosal fibroid   Electronically Signed   By: Jorje Guild M.D.   On: 04/11/2013 07:01   Motrin provided  Plan d/c home, f/u OB GYN. RX Tramadol prn. Return precautions provided. GC/ Chl cultures pending. PT has scheduled f/u GYN this week.   MDM  Dx: Fibroid of uterus, vaginal bleeding  [place], labs, UA, wet pre as above Medication provided VS and nurses notes reviewed  Teressa Lower, MD 04/11/13 2303

## 2013-04-11 NOTE — Discharge Instructions (Signed)
Fibroids Fibroids are lumps (tumors) that can occur any place in a woman's body. These lumps are not cancerous. Fibroids vary in size, weight, and where they grow. HOME CARE  Do not take aspirin.  Write down the number of pads or tampons you use during your period. Tell your doctor. This can help determine the best treatment for you. GET HELP RIGHT AWAY IF:  You have pain in your lower belly (abdomen) that is not helped with medicine.  You have cramps that are not helped with medicine.  You have more bleeding between or during your period.  You feel lightheaded or pass out (faint).  Your lower belly pain gets worse. MAKE SURE YOU:  Understand these instructions.  Will watch your condition.  Will get help right away if you are not doing well or get worse. Document Released: 04/19/2010 Document Revised: 06/09/2011 Document Reviewed: 04/19/2010 [facility] Patient Information 2014 [place], [place].  Abnormal Uterine Bleeding Abnormal uterine bleeding means bleeding from the vagina that is not your normal menstrual period. This can be:  Bleeding or spotting between periods.  Bleeding after sex (sexual intercourse).  Bleeding that is heavier or more than normal.  Periods that last longer than usual.  Bleeding after menopause. There are many problems that may cause this. Treatment will depend on the cause of the bleeding. Any kind of bleeding that is not normal should be reviewed by your doctor.  HOME CARE Watch your condition for any changes. These actions may lessen any discomfort you are having:  Do not use tampons or douches as told by your doctor.  Change your pads often. You should get regular pelvic exams and Pap tests. Keep all appointments for tests as told by your doctor. GET HELP IF:  You are bleeding for more than 1 week.  You feel dizzy at times. GET HELP RIGHT AWAY IF:   You pass out.  You have to change pads every 15 to 30 minutes.  You have belly  pain.  You have a fever.  You become sweaty or weak.  You are passing large blood clots from the vagina.  You feel sick to your stomach (nauseous) and throw up (vomit). MAKE SURE YOU:  Understand these instructions.  Will watch your condition.  Will get help right away if you are not doing well or get worse. Document Released: 01/12/2009 Document Revised: 01/05/2013 Document Reviewed: 10/14/2012 [facility] Patient Information 2014 [place], [place].

## 2013-11-28 ENCOUNTER — Encounter: Payer: Self-pay | Admitting: Internal Medicine

## 2013-11-28 ENCOUNTER — Other Ambulatory Visit (INDEPENDENT_AMBULATORY_CARE_PROVIDER_SITE_OTHER): Payer: BC Managed Care – PPO

## 2013-11-28 ENCOUNTER — Ambulatory Visit (INDEPENDENT_AMBULATORY_CARE_PROVIDER_SITE_OTHER): Payer: BC Managed Care – PPO | Admitting: Internal Medicine

## 2013-11-28 VITALS — BP 118/92 | HR 83 | Temp 98.5°F | Resp 18 | Ht 64.0 in | Wt 177.0 lb

## 2013-11-28 DIAGNOSIS — K2100 Gastro-esophageal reflux disease with esophagitis, without bleeding: Secondary | ICD-10-CM

## 2013-11-28 DIAGNOSIS — K760 Fatty (change of) liver, not elsewhere classified: Secondary | ICD-10-CM | POA: Insufficient documentation

## 2013-11-28 DIAGNOSIS — K219 Gastro-esophageal reflux disease without esophagitis: Secondary | ICD-10-CM | POA: Insufficient documentation

## 2013-11-28 DIAGNOSIS — E669 Obesity, unspecified: Secondary | ICD-10-CM | POA: Insufficient documentation

## 2013-11-28 DIAGNOSIS — K7689 Other specified diseases of liver: Secondary | ICD-10-CM

## 2013-11-28 LAB — URINALYSIS
Bilirubin Urine: NEGATIVE
KETONES UR: NEGATIVE
Leukocytes, UA: NEGATIVE
Nitrite: NEGATIVE
SPECIFIC GRAVITY, URINE: 1.015 (ref 1.000–1.030)
TOTAL PROTEIN, URINE-UPE24: NEGATIVE
Urine Glucose: NEGATIVE
Urobilinogen, UA: 0.2 (ref 0.0–1.0)
pH: 7 (ref 5.0–8.0)

## 2013-11-28 LAB — BASIC METABOLIC PANEL
BUN: 9 mg/dL (ref 6–23)
CALCIUM: 9.3 mg/dL (ref 8.4–10.5)
CO2: 25 mEq/L (ref 19–32)
CREATININE: 0.8 mg/dL (ref 0.4–1.2)
Chloride: 103 mEq/L (ref 96–112)
GFR: 89.25 mL/min (ref >60.00)
Glucose, Bld: 99 mg/dL (ref 70–99)
Potassium: 4.4 mEq/L (ref 3.5–5.1)
Sodium: 137 mEq/L (ref 135–145)

## 2013-11-28 LAB — IBC PANEL
Iron: 59 ug/dL (ref 42–145)
SATURATION RATIOS: 13.9 % — AB (ref 20.0–50.0)
TRANSFERRIN: 302.5 mg/dL (ref 212.0–360.0)

## 2013-11-28 LAB — CBC WITH DIFFERENTIAL/PLATELET
BASOS PCT: 0.6 % (ref 0.0–3.0)
Basophils Absolute: 0.1 10*3/uL (ref 0.0–0.1)
EOS ABS: 0.6 10*3/uL (ref 0.0–0.7)
EOS PCT: 5.6 % — AB (ref 0.0–5.0)
HCT: 43.2 % (ref 36.0–46.0)
Hemoglobin: 14.1 g/dL (ref 12.0–15.0)
Lymphocytes Relative: 34.9 % (ref 12.0–46.0)
Lymphs Abs: 3.9 10*3/uL (ref 0.7–4.0)
MCHC: 32.6 g/dL (ref 30.0–36.0)
MCV: 82.1 fl (ref 78.0–100.0)
MONO ABS: 0.8 10*3/uL (ref 0.1–1.0)
Monocytes Relative: 7.5 % (ref 3.0–12.0)
NEUTROS PCT: 51.4 % (ref 43.0–77.0)
Neutro Abs: 5.8 10*3/uL (ref 1.4–7.7)
Platelets: 281 10*3/uL (ref 150.0–400.0)
RBC: 5.27 Mil/uL — AB (ref 3.87–5.11)
RDW: 14.3 % (ref 11.5–15.5)
WBC: 11.2 10*3/uL — AB (ref 4.0–10.5)

## 2013-11-28 LAB — HEPATIC FUNCTION PANEL
ALT: 11 U/L (ref 0–35)
AST: 15 U/L (ref 0–37)
Albumin: 4 g/dL (ref 3.5–5.2)
Alkaline Phosphatase: 74 U/L (ref 39–117)
BILIRUBIN TOTAL: 0.6 mg/dL (ref 0.2–1.2)
Bilirubin, Direct: 0.1 mg/dL (ref 0.0–0.3)
Total Protein: 7.5 g/dL (ref 6.0–8.3)

## 2013-11-28 LAB — H. PYLORI ANTIBODY, IGG: H Pylori IgG: NEGATIVE

## 2013-11-28 MED ORDER — OMEPRAZOLE 40 MG PO CPDR: 40.0000 mg | DELAYED_RELEASE_CAPSULE | Freq: Every day | ORAL | Status: DC

## 2013-11-28 MED ORDER — VITAMIN D 1000 UNITS PO TABS: 1000.0000 [IU] | ORAL_TABLET | Freq: Every day | ORAL | Status: AC

## 2013-11-28 NOTE — Progress Notes (Signed)
   Subjective:    HPI  C/o abd discomfort, diarrhea/constipation C/o Wt gain  BP Readings from Last 3 Encounters:  11/28/13 118/92  04/11/13 116/69  06/24/10 112/76   Wt Readings from Last 3 Encounters:  11/28/13 177 lb 0.6 oz (80.305 kg)  04/11/13 174 lb 4 oz (79.039 kg)  06/24/10 154 lb 6.4 oz (70.035 kg)      Review of Systems  Constitutional: Positive for unexpected weight change. Negative for chills, activity change, appetite change and fatigue.  HENT: Negative for congestion, mouth sores and sinus pressure.   Eyes: Negative for visual disturbance.  Respiratory: Negative for cough and chest tightness.   Gastrointestinal: Positive for abdominal pain, diarrhea and constipation. Negative for nausea.  Genitourinary: Negative for frequency, difficulty urinating and vaginal pain.  Musculoskeletal: Negative for back pain and gait problem.  Skin: Negative for pallor and rash.  Neurological: Negative for dizziness, tremors, weakness, numbness and headaches.  Psychiatric/Behavioral: Negative for confusion and sleep disturbance.       Objective:   Physical Exam  Constitutional: She appears well-developed. No distress.  HENT:  Head: Normocephalic.  Right Ear: External ear normal.  Left Ear: External ear normal.  Nose: Nose normal.  Mouth/Throat: Oropharynx is clear and moist.  Eyes: Conjunctivae are normal. Pupils are equal, round, and reactive to light. Right eye exhibits no discharge. Left eye exhibits no discharge.  Neck: Normal range of motion. Neck supple. No JVD present. No tracheal deviation present. No thyromegaly present.  Cardiovascular: Normal rate, regular rhythm and normal heart sounds.   Pulmonary/Chest: No stridor. No respiratory distress. She has no wheezes.  Abdominal: Soft. Bowel sounds are normal. She exhibits no distension and no mass. There is no tenderness. There is no rebound and no guarding.  Musculoskeletal: She exhibits no edema and no tenderness.    Lymphadenopathy:    She has no cervical adenopathy.  Neurological: She displays normal reflexes. No cranial nerve deficit. She exhibits normal muscle tone. Coordination normal.  Skin: No rash noted. No erythema.  Psychiatric: Her behavior is normal. Judgment and thought content normal.    Lab Results  Component Value Date   WBC 11.2* 11/28/2013   HGB 14.1 11/28/2013   HCT 43.2 11/28/2013   PLT 281.0 11/28/2013   GLUCOSE 99 11/28/2013   ALT 11 11/28/2013   AST 15 11/28/2013   NA 137 11/28/2013   K 4.4 11/28/2013   CL 103 11/28/2013   CREATININE 0.8 11/28/2013   BUN 9 11/28/2013   CO2 25 11/28/2013   TSH 1.48 11/28/2013         Assessment & Plan:

## 2013-11-28 NOTE — Assessment & Plan Note (Signed)
Labs

## 2013-11-28 NOTE — Assessment & Plan Note (Signed)
Wt Readings from Last 3 Encounters:  11/28/13 177 lb 0.6 oz (80.305 kg)  04/11/13 174 lb 4 oz (79.039 kg)  06/24/10 154 lb 6.4 oz (70.035 kg)

## 2013-11-28 NOTE — Assessment & Plan Note (Addendum)
Omeprazole 40 qd Labs

## 2013-11-28 NOTE — Patient Instructions (Signed)
Gluten free trial (no wheat products) for 4-6 weeks. OK to use gluten-free bread and gluten-free pasta.  Milk free trial (no milk, ice cream, cheese and yogurt) for 4-6 weeks. OK to use almond, coconut, rice or soy milk. "Almond breeze" brand tastes good.  

## 2013-11-28 NOTE — Progress Notes (Signed)
Pre visit review using our clinic review tool, if applicable. No additional management support is needed unless otherwise documented below in the visit note. 

## 2013-11-29 LAB — TSH: TSH: 1.48 u[IU]/mL (ref 0.35–4.50)

## 2013-11-29 LAB — VITAMIN B12: VITAMIN B 12: 400 pg/mL (ref 211–911)

## 2013-11-29 LAB — VITAMIN D 25 HYDROXY (VIT D DEFICIENCY, FRACTURES): VITD: 36.63 ng/mL (ref 30.00–100.00)

## 2013-11-30 ENCOUNTER — Telehealth: Payer: Self-pay | Admitting: Internal Medicine

## 2013-11-30 NOTE — Telephone Encounter (Signed)
Pt request the lab result that was done 11/28/13 from Dr. Camila Li. Please call pt with the result.

## 2013-12-01 NOTE — Telephone Encounter (Signed)
Pt called back to obtain results to her lab work

## 2013-12-02 NOTE — Telephone Encounter (Signed)
Pt informed of lab results on 12/01/13. See labs

## 2014-01-09 ENCOUNTER — Ambulatory Visit: Payer: BC Managed Care – PPO | Admitting: Internal Medicine

## 2014-01-23 ENCOUNTER — Ambulatory Visit (INDEPENDENT_AMBULATORY_CARE_PROVIDER_SITE_OTHER): Payer: BC Managed Care – PPO | Admitting: Internal Medicine

## 2014-01-23 ENCOUNTER — Encounter: Payer: Self-pay | Admitting: Internal Medicine

## 2014-01-23 VITALS — BP 122/80 | HR 76 | Temp 97.6°F | Resp 16 | Ht 64.0 in | Wt 172.8 lb

## 2014-01-23 DIAGNOSIS — K279 Peptic ulcer, site unspecified, unspecified as acute or chronic, without hemorrhage or perforation: Secondary | ICD-10-CM

## 2014-01-23 DIAGNOSIS — L508 Other urticaria: Secondary | ICD-10-CM

## 2014-01-23 DIAGNOSIS — G4730 Sleep apnea, unspecified: Secondary | ICD-10-CM

## 2014-01-23 DIAGNOSIS — F17200 Nicotine dependence, unspecified, uncomplicated: Secondary | ICD-10-CM

## 2014-01-23 DIAGNOSIS — K219 Gastro-esophageal reflux disease without esophagitis: Secondary | ICD-10-CM

## 2014-01-23 MED ORDER — DOXEPIN HCL 25 MG PO CAPS: 25.0000 mg | ORAL_CAPSULE | Freq: Every evening | ORAL | Status: AC | PRN

## 2014-01-23 NOTE — Progress Notes (Signed)
Pre visit review using our clinic review tool, if applicable. No additional management support is needed unless otherwise documented below in the visit note. 

## 2014-01-23 NOTE — Patient Instructions (Signed)
Hives Hives are itchy, red, swollen areas of the skin. They can vary in size and location on your body. Hives can come and go for hours or several days (acute hives) or for several weeks (chronic hives). Hives do not spread from person to person (noncontagious). They may get worse with scratching, exercise, and emotional stress. CAUSES   Allergic reaction to food, additives, or drugs.  Infections, including the common cold.  Illness, such as vasculitis, lupus, or thyroid disease.  Exposure to sunlight, heat, or cold.  Exercise.  Stress.  Contact with chemicals. SYMPTOMS   Red or white swollen patches on the skin. The patches may change size, shape, and location quickly and repeatedly.  Itching.  Swelling of the hands, feet, and face. This may occur if hives develop deeper in the skin. DIAGNOSIS  Your caregiver can usually tell what is wrong by performing a physical exam. Skin or blood tests may also be done to determine the cause of your hives. In some cases, the cause cannot be determined. TREATMENT  Mild cases usually get better with medicines such as antihistamines. Severe cases may require an emergency epinephrine injection. If the cause of your hives is known, treatment includes avoiding that trigger.  HOME CARE INSTRUCTIONS   Avoid causes that trigger your hives.  Take antihistamines as directed by your caregiver to reduce the severity of your hives. Non-sedating or low-sedating antihistamines are usually recommended. Do not drive while taking an antihistamine.  Take any other medicines prescribed for itching as directed by your caregiver.  Wear loose-fitting clothing.  Keep all follow-up appointments as directed by your caregiver. SEEK MEDICAL CARE IF:   You have persistent or severe itching that is not relieved with medicine.  You have painful or swollen joints. SEEK IMMEDIATE MEDICAL CARE IF:   You have a fever.  Your tongue or lips are swollen.  You have  trouble breathing or swallowing.  You feel tightness in the throat or chest.  You have abdominal pain. These problems may be the first sign of a life-threatening allergic reaction. Call your local emergency services (911 in [place] MAKE SURE YOU:   Understand these instructions.  Will watch your condition.  Will get help right away if you are not doing well or get worse. Document Released: 03/17/2005 Document Revised: 03/22/2013 Document Reviewed: 06/10/2011 [employer] Patient Information 2015 [employer]. This information is not intended to replace advice given to you by your health care provider. Make sure you discuss any questions you have with your health care provider.  

## 2014-01-23 NOTE — Progress Notes (Signed)
   Subjective:    Patient ID: Christina Salazar, female    DOB: 20-Feb-1981, 33 y.o.   MRN: [id number]  HPI Comments: She returns complaining of an intermittent rash, she describes it as itchy splotches that come and go from different parts of her face, torso, neck, and chest. She has been taking ibuprofen. The rash goes away after a dose of benadryl but then it returns.     Review of Systems  Constitutional: Negative.  Negative for fever, chills, diaphoresis, appetite change and fatigue.  HENT: Negative.  Negative for congestion.   Eyes: Negative.   Respiratory: Negative.  Negative for cough, choking, chest tightness, shortness of breath, wheezing and stridor.   Cardiovascular: Negative.  Negative for chest pain, palpitations and leg swelling.  Gastrointestinal: Negative.  Negative for nausea, vomiting, abdominal pain, diarrhea, constipation and blood in stool.  Endocrine: Negative.   Genitourinary: Negative.   Musculoskeletal: Negative.  Negative for arthralgias, back pain, joint swelling, myalgias and neck stiffness.  Skin: Positive for rash. Negative for color change, pallor and wound.  Allergic/Immunologic: Negative.   Neurological: Negative.   Hematological: Negative.  Negative for adenopathy. Does not bruise/bleed easily.  Psychiatric/Behavioral: Negative.        Objective:   Physical Exam  Vitals reviewed. Constitutional: She is oriented to person, place, and time. She appears well-developed and well-nourished. No distress.  HENT:  Head: Normocephalic and atraumatic.  Mouth/Throat: Oropharynx is clear and moist. No oropharyngeal exudate.  Eyes: Conjunctivae are normal. Right eye exhibits no discharge. Left eye exhibits no discharge. No scleral icterus.  Neck: Normal range of motion. Neck supple. No JVD present. No tracheal deviation present. No thyromegaly present.  Cardiovascular: Normal rate, regular rhythm, normal heart sounds and intact distal pulses.  Exam reveals no gallop and  no friction rub.   No murmur heard. Pulmonary/Chest: Effort normal and breath sounds normal. No stridor. No respiratory distress. She has no wheezes. She has no rales. She exhibits no tenderness.  Abdominal: Soft. Bowel sounds are normal. She exhibits no distension and no mass. There is no tenderness. There is no rebound and no guarding.  Musculoskeletal: Normal range of motion. She exhibits no edema and no tenderness.  Lymphadenopathy:    She has no cervical adenopathy.  Neurological: She is oriented to person, place, and time.  Skin: Skin is warm and dry. No rash noted. She is not diaphoretic. No erythema. No pallor.     Lab Results  Component Value Date   WBC 11.2* 11/28/2013   HGB 14.1 11/28/2013   HCT 43.2 11/28/2013   PLT 281.0 11/28/2013   GLUCOSE 99 11/28/2013   ALT 11 11/28/2013   AST 15 11/28/2013   NA 137 11/28/2013   K 4.4 11/28/2013   CL 103 11/28/2013   CREATININE 0.8 11/28/2013   BUN 9 11/28/2013   CO2 25 11/28/2013   TSH 1.48 11/28/2013        Assessment & Plan:

## 2014-01-24 ENCOUNTER — Telehealth: Payer: Self-pay | Admitting: Internal Medicine

## 2014-01-24 ENCOUNTER — Encounter: Payer: Self-pay | Admitting: Internal Medicine

## 2014-01-24 NOTE — Telephone Encounter (Signed)
emmi mailed  °

## 2014-01-24 NOTE — Assessment & Plan Note (Signed)
I have asked her to stop motrin and all nsaids There is no rash today Her recent labs were normal Will start doxepin to prevent the hives from developing

## 2014-03-20 ENCOUNTER — Emergency Department (HOSPITAL_COMMUNITY)
Admission: EM | Admit: 2014-03-20 | Discharge: 2014-03-20 | Disposition: A | Discharge status: Home / Self Care | Payer: BC Managed Care – PPO | Attending: Emergency Medicine | Admitting: Emergency Medicine

## 2014-03-20 ENCOUNTER — Encounter (HOSPITAL_COMMUNITY): Payer: Self-pay | Admitting: *Deleted

## 2014-03-20 DIAGNOSIS — B029 Zoster without complications: Secondary | ICD-10-CM | POA: Insufficient documentation

## 2014-03-20 DIAGNOSIS — Z720 Tobacco use: Secondary | ICD-10-CM | POA: Insufficient documentation

## 2014-03-20 DIAGNOSIS — Z3202 Encounter for pregnancy test, result negative: Secondary | ICD-10-CM | POA: Diagnosis not present

## 2014-03-20 DIAGNOSIS — Z79899 Other long term (current) drug therapy: Secondary | ICD-10-CM | POA: Insufficient documentation

## 2014-03-20 DIAGNOSIS — R21 Rash and other nonspecific skin eruption: Secondary | ICD-10-CM | POA: Diagnosis present

## 2014-03-20 DIAGNOSIS — F329 Major depressive disorder, single episode, unspecified: Secondary | ICD-10-CM | POA: Diagnosis not present

## 2014-03-20 DIAGNOSIS — K219 Gastro-esophageal reflux disease without esophagitis: Secondary | ICD-10-CM | POA: Insufficient documentation

## 2014-03-20 LAB — POC URINE PREG, ED: PREG TEST UR: NEGATIVE

## 2014-03-20 MED ORDER — ACYCLOVIR 200 MG PO CAPS
800.0000 mg | ORAL_CAPSULE | Freq: Once | ORAL | Status: AC
  Administered 2014-03-20: 800 mg via ORAL
  Filled 2014-03-20: qty 4

## 2014-03-20 MED ORDER — HYDROCODONE-ACETAMINOPHEN 5-325 MG PO TABS: 1.0000 | ORAL_TABLET | Freq: Four times a day (QID) | ORAL | Status: AC | PRN

## 2014-03-20 MED ORDER — HYDROCODONE-ACETAMINOPHEN 5-325 MG PO TABS: 1.0000 | ORAL_TABLET | Freq: Once | ORAL | Status: DC

## 2014-03-20 MED ORDER — IBUPROFEN 600 MG PO TABS: 600.0000 mg | ORAL_TABLET | Freq: Four times a day (QID) | ORAL | Status: AC | PRN

## 2014-03-20 MED ORDER — DEXAMETHASONE SODIUM PHOSPHATE 10 MG/ML IJ SOLN
10.0000 mg | Freq: Once | INTRAMUSCULAR | Status: AC
  Administered 2014-03-20: 10 mg via INTRAMUSCULAR
  Filled 2014-03-20: qty 1

## 2014-03-20 MED ORDER — IBUPROFEN 400 MG PO TABS
600.0000 mg | ORAL_TABLET | Freq: Once | ORAL | Status: AC
  Administered 2014-03-20: 03:00:00 600 mg via ORAL
  Filled 2014-03-20 (×2): qty 1

## 2014-03-20 MED ORDER — ACYCLOVIR 800 MG PO TABS: 800.0000 mg | ORAL_TABLET | Freq: Every day | ORAL | Status: AC

## 2014-03-20 NOTE — Discharge Instructions (Signed)
It appears that the rash is from shingles. Please take the medicine requested.  Return to the ER if the symptoms are getting worse.   Shingles Shingles (herpes zoster) is an infection that is caused by the same virus that causes chickenpox (varicella). The infection causes a painful skin rash and fluid-filled blisters, which eventually break open, crust over, and heal. It may occur in any area of the body, but it usually affects only one side of the body or face. The pain of shingles usually lasts about 1 month. However, some people with shingles may develop long-term (chronic) pain in the affected area of the body. Shingles often occurs many years after the person had chickenpox. It is more common:  In people older than 50 years.  In people with weakened immune systems, such as those with HIV, AIDS, or cancer.  In people taking medicines that weaken the immune system, such as transplant medicines.  In people under great stress. CAUSES  Shingles is caused by the varicella zoster virus (VZV), which also causes chickenpox. After a person is infected with the virus, it can remain in the person's body for years in an inactive state (dormant). To cause shingles, the virus reactivates and breaks out as an infection in a nerve root. The virus can be spread from person to person (contagious) through contact with open blisters of the shingles rash. It will only spread to people who have not had chickenpox. When these people are exposed to the virus, they may develop chickenpox. They will not develop shingles. Once the blisters scab over, the person is no longer contagious and cannot spread the virus to others. SIGNS AND SYMPTOMS  Shingles shows up in stages. The initial symptoms may be pain, itching, and tingling in an area of the skin. This pain is usually described as burning, stabbing, or throbbing.In a few days or weeks, a painful red rash will appear in the area where the pain, itching, and  tingling were felt. The rash is usually on one side of the body in a band or belt-like pattern. Then, the rash usually turns into fluid-filled blisters. They will scab over and dry up in approximately 2-3 weeks. Flu-like symptoms may also occur with the initial symptoms, the rash, or the blisters. These may include:  Fever.  Chills.  Headache.  Upset stomach. DIAGNOSIS  Your health care provider will perform a skin exam to diagnose shingles. Skin scrapings or fluid samples may also be taken from the blisters. This sample will be examined under a microscope or sent to a lab for further testing. TREATMENT  There is no specific cure for shingles. Your health care provider will likely prescribe medicines to help you manage the pain, recover faster, and avoid long-term problems. This may include antiviral drugs, anti-inflammatory drugs, and pain medicines. HOME CARE INSTRUCTIONS   Take a cool bath or apply cool compresses to the area of the rash or blisters as directed. This may help with the pain and itching.   Take medicines only as directed by your health care provider.   Rest as directed by your health care provider.  Keep your rash and blisters clean with mild soap and cool water or as directed by your health care provider.  Do not pick your blisters or scratch your rash. Apply an anti-itch cream or numbing creams to the affected area as directed by your health care provider.  Keep your shingles rash covered with a loose bandage (dressing).  Avoid skin contact with:  Babies.   Pregnant women.   Children with eczema.   Elderly people with transplants.   People with chronic illnesses, such as leukemia or AIDS.   Wear loose-fitting clothing to help ease the pain of material rubbing against the rash.  Keep all follow-up visits as directed by your health care provider.If the area involved is on your face, you may receive a referral for a specialist, such as an eye doctor  (ophthalmologist) or an ear, nose, and throat (ENT) doctor. Keeping all follow-up visits will help you avoid eye problems, chronic pain, or disability.  SEEK IMMEDIATE MEDICAL CARE IF:   You have facial pain, pain around the eye area, or loss of feeling on one side of your face.  You have ear pain or ringing in your ear.  You have loss of taste.  Your pain is not relieved with prescribed medicines.   Your redness or swelling spreads.   You have more pain and swelling.  Your condition is worsening or has changed.   You have a fever. MAKE SURE YOU:  Understand these instructions.  Will watch your condition.  Will get help right away if you are not doing well or get worse. Document Released: 03/17/2005 Document Revised: 08/01/2013 Document Reviewed: 10/30/2011 [facility] Patient Information 2015 [place], [place]. This information is not intended to replace advice given to you by your health care provider. Make sure you discuss any questions you have with your health care provider.

## 2014-03-20 NOTE — ED Provider Notes (Signed)
CSN: [id number]     Arrival date & time 03/20/14  8101 History  This chart was scribed for Varney Biles, MD by Rayfield Citizen, ED Scribe. This patient was seen in room D34C/D34C and the patient's care was started at 1:33 AM.    Chief Complaint  Patient presents with  . Rash   Patient is a 33 y.o. female presenting with rash. The history is provided by the patient. No language interpreter was used.  Rash Associated symptoms: fatigue      HPI Comments: Christina Salazar is an otherwise healthy 33 y.o. female who presents to the Emergency Department complaining of 3-4 days of "flu like symptoms (fatigue, low-grade subjective fever)" and left-sided neck pain with "burning" rash to the left neck beginning today. She denies issues with her vision or hearing. She denies new exposures to medications, soap, detergents, etc. She denies any recent wounds to the affected area.   She states that she did have chicken pox as a child. She has allergies to aloe and shell-fish derived products; no other known allergies.   Past Medical History  Diagnosis Date  . Depression   . GERD (gastroesophageal reflux disease)   . Headache(784.0)   . Peptic ulcer disease    Past Surgical History  Procedure Laterality Date  . Cholecystectomy  1999   Family History  Problem Relation Age of Onset  . Alcohol abuse      Family hx  . Breast cancer      1st degree relative  . Depression      Family Hx  . Diabetes      1st degree relative  . Hyperlipidemia    . Hypertension    . Emphysema Paternal Grandmother   . Heart disease Paternal Grandmother   . Lung cancer Paternal Grandmother   . Asthma Sister   . Asthma Brother   . Crohn's disease Brother   . Heart disease Father   . Liver cancer Father     chirrosis to cancer  . Clotting disorder Mother   . Breast cancer Mother   . Liver cancer Paternal Grandfather    History  Substance Use Topics  . Smoking status: Current Every Day Smoker -- 0.50 packs/day  .  Smokeless tobacco: Not on file     Comment: started at age 43.  . Alcohol Use: Yes   OB History    No data available     Review of Systems  Constitutional: Positive for fatigue.  Skin: Positive for rash.  All other systems reviewed and are negative.  Allergies  Aloe and Shellfish-derived products  Home Medications   Prior to Admission medications   Medication Sig Start Date End Date Taking? Authorizing Provider  albuterol (VENTOLIN HFA) 108 (90 BASE) MCG/ACT inhaler Inhale 2 puffs into the lungs every 6 (six) hours as needed for wheezing or shortness of breath.    Yes Historical Provider, MD  ALPRAZolam Duanne Moron) 0.5 MG tablet Take 0.5 mg by mouth at bedtime as needed for anxiety.   Yes Historical Provider, MD  Ascorbic Acid (VITAMIN C PO) Take 1 tablet by mouth daily.   Yes Historical Provider, MD  cholecalciferol (VITAMIN D) 1000 UNITS tablet Take 1 tablet (1,000 Units total) by mouth daily. 11/28/13 11/28/14 Yes Aleksei Plotnikov V, MD  Multiple Vitamins-Minerals (ZINC PO) Take 1 tablet by mouth daily.   Yes Historical Provider, MD  omeprazole (PRILOSEC) 40 MG capsule Take 1 capsule (40 mg total) by mouth daily. 11/28/13  Yes Aleksei Plotnikov  V, MD  acyclovir (ZOVIRAX) 800 MG tablet Take 1 tablet (800 mg total) by mouth 5 (five) times daily. 03/20/14   Varney Biles, MD  doxepin (SINEQUAN) 25 MG capsule Take 1 capsule (25 mg total) by mouth at bedtime as needed. Patient not taking: Reported on 03/20/2014 01/23/14   Janith Lima, MD  HYDROcodone-acetaminophen (NORCO/VICODIN) 5-325 MG per tablet Take 1 tablet by mouth every 6 (six) hours as needed. 03/20/14   Varney Biles, MD  ibuprofen (ADVIL,MOTRIN) 600 MG tablet Take 1 tablet (600 mg total) by mouth every 6 (six) hours as needed. 03/20/14   Raelyn Racette, MD   BP 122/72 mmHg  Pulse 84  Temp(Src) 98.6 F (37 C) (Oral)  Resp 16  SpO2 100%  LMP 03/16/2014 Physical Exam  Constitutional: She is oriented to person, place, and  time. She appears well-developed and well-nourished.  HENT:  Head: Normocephalic and atraumatic.  Mouth/Throat: Oropharynx is clear and moist. No oropharyngeal exudate.  Eyes: EOM are normal. Pupils are equal, round, and reactive to light.  Neck:  Posterior, submandibular erythematous rash on both sides of neck with left more prominent than right; TTP. There is no crepitus, no fluctuance, no pustules or vesicles appreciated.   Cardiovascular: Normal rate, regular rhythm and normal heart sounds.  Exam reveals no gallop and no friction rub.   No murmur heard. Pulmonary/Chest: Effort normal and breath sounds normal. No respiratory distress. She has no wheezes. She has no rales.  Abdominal: Soft. There is no tenderness. There is no rebound and no guarding.  Musculoskeletal: Normal range of motion. She exhibits no edema.  Lymphadenopathy:    She has cervical adenopathy (Mild).  Neurological: She is alert and oriented to person, place, and time.  Skin: Skin is warm and dry. Rash noted.  Psychiatric: She has a normal mood and affect. Her behavior is normal.  Nursing note and vitals reviewed.   ED Course  Procedures   DIAGNOSTIC STUDIES: Oxygen Saturation is 100% on RA, normal by my interpretation.    COORDINATION OF CARE: 1:43 AM Discussed treatment plan with pt at bedside and pt agreed to plan.   Labs Review Labs Reviewed  POC URINE PREG, ED    Imaging Review No results found.   EKG Interpretation None      MDM   Final diagnoses:  Shingles rash    I personally performed the services described in this documentation, which was scribed in my presence. The recorded information has been reviewed and is accurate.  Pt comes in w/ paincful rash to the neck. Sudden onset. She has been having some flu like sx for the past 3 days. The rash, with burning type pain, makes this appear to be a neuropathic type pain from shingles. We dont see the classing vesicles..... However, this  could be the beginning stages. Return precautions discussed.      Varney Biles, MD 03/20/14 [phone]

## 2014-03-20 NOTE — ED Notes (Signed)
The pt is c/o a rash to the lt neck. She has had neck pain for 3-4 days with the rash there tioday.,  She thinks she has shingles.  lmp 12 15

## 2014-03-20 NOTE — ED Notes (Signed)
Reddened area to left neck.  States it burns so I was afraid it could be shingles.

## 2014-04-26 ENCOUNTER — Ambulatory Visit: Payer: BC Managed Care – PPO | Admitting: Internal Medicine

## 2014-06-26 ENCOUNTER — Other Ambulatory Visit: Payer: Self-pay | Admitting: Internal Medicine

## 2014-09-24 IMAGING — US US PELVIS COMPLETE
1 series · 14 of 25 positions shown · non-contrast
Comparison: None.

CLINICAL DATA: Pelvic pain.

EXAM:
TRANSABDOMINAL ULTRASOUND OF PELVIS
TECHNIQUE: Transabdominal ultrasound examination of the pelvis was performed
including evaluation of the uterus, ovaries, adnexal regions, and
pelvic cul-de-sac.

[Series 1: us pelvis complete · 0.24mm/px · 14 of 58 slices shown]
[im 1/58]
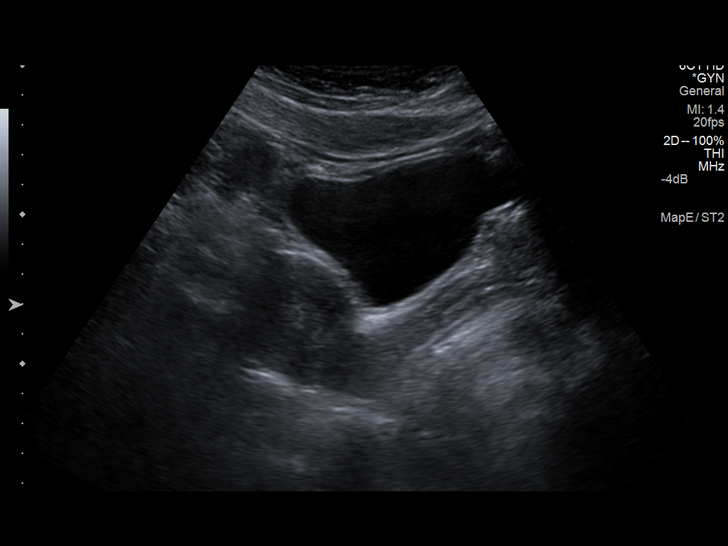
[im 5/58]
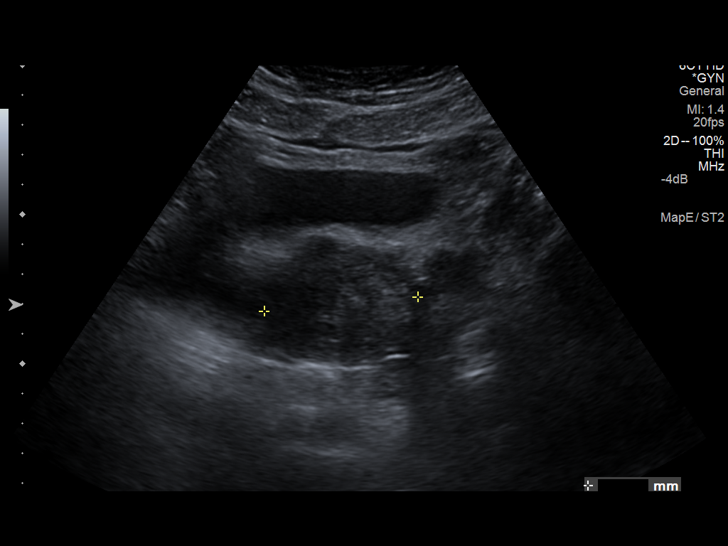
[im 10/58]
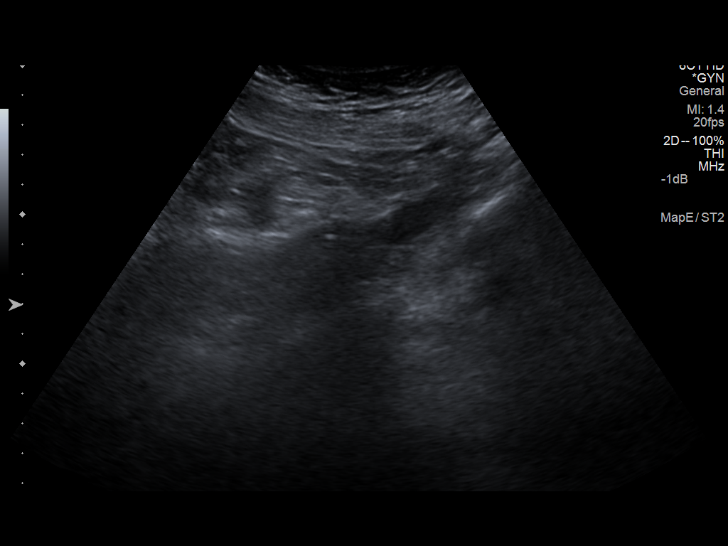
[im 15/58]
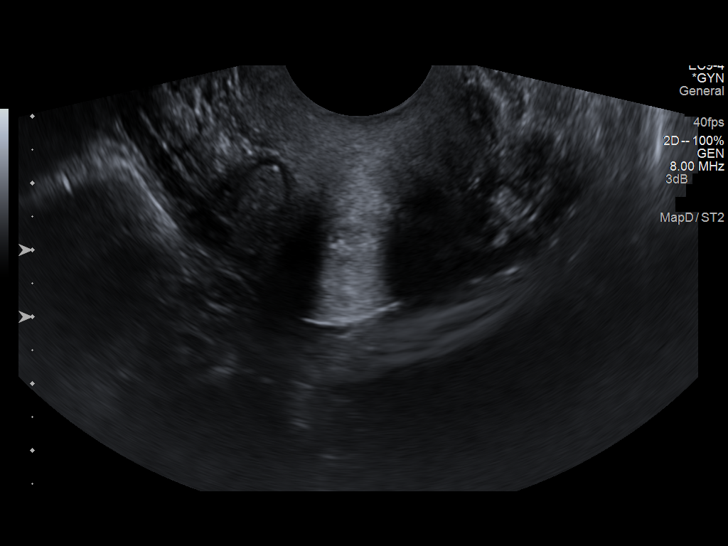
[im 20/58]
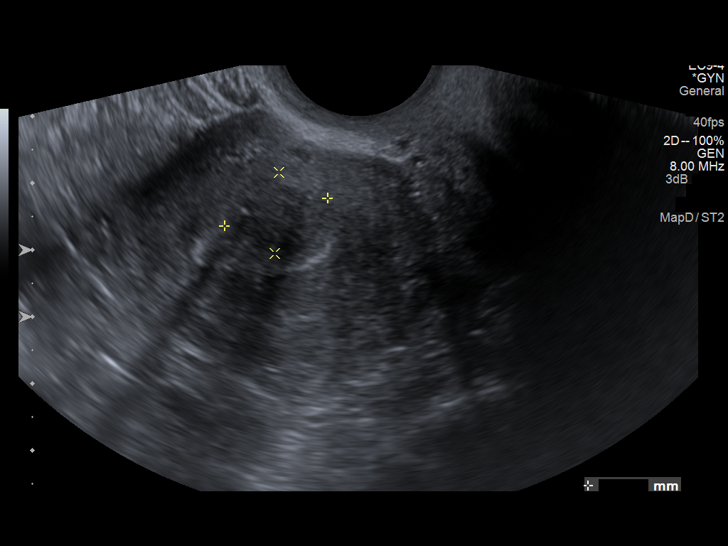
[im 22/58]
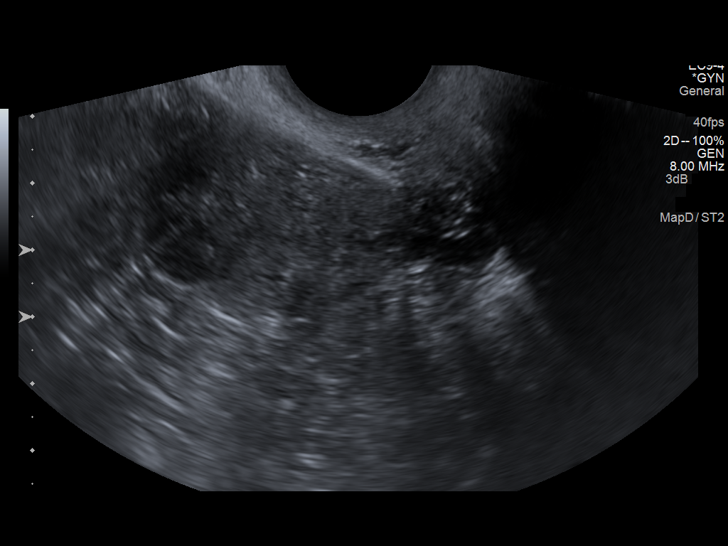
[im 27/58]
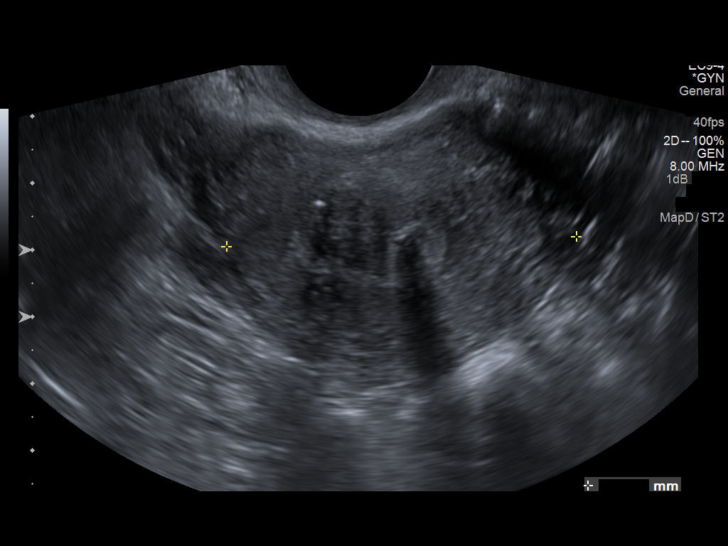
[im 31/58]
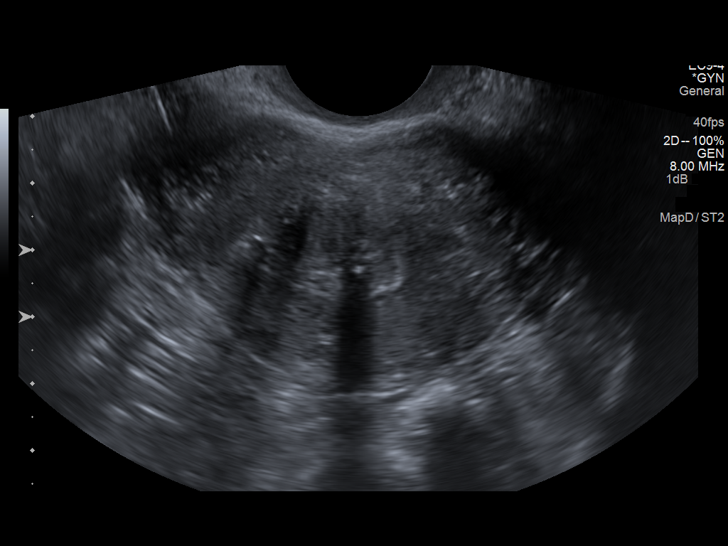
[im 36/58]
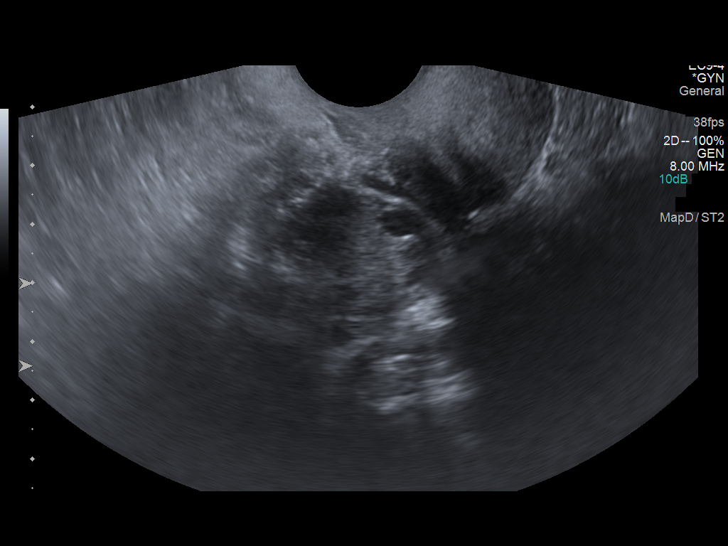
[im 39/58]
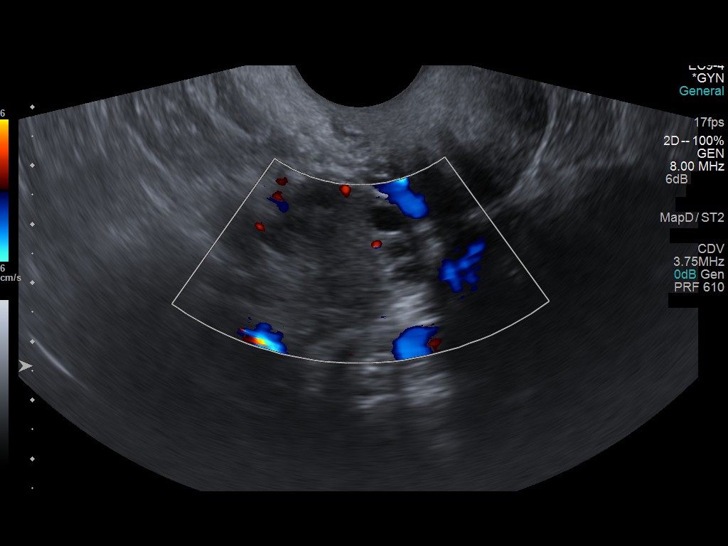
[im 43/58]
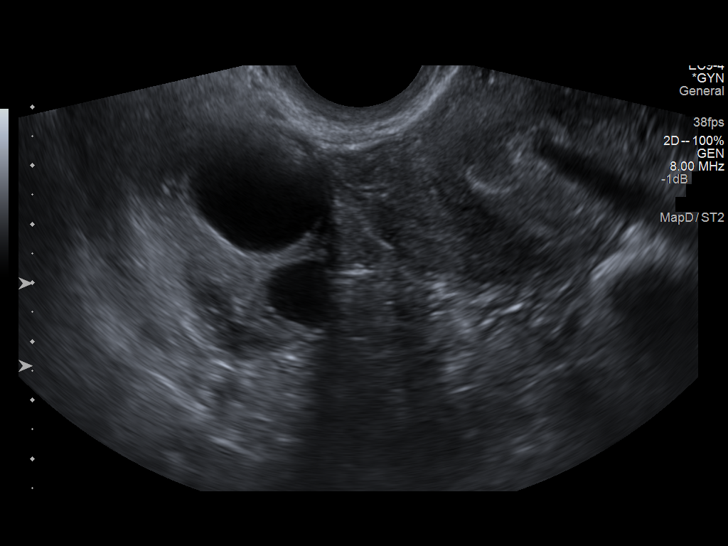
[im 48/58]
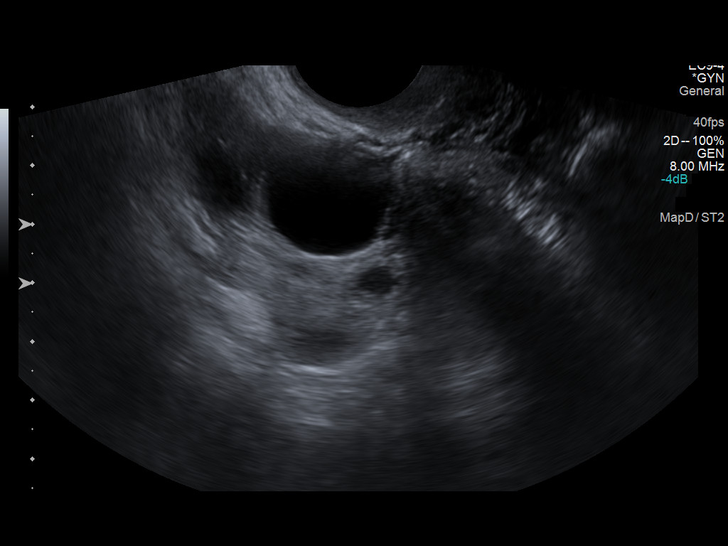
[im 53/58]
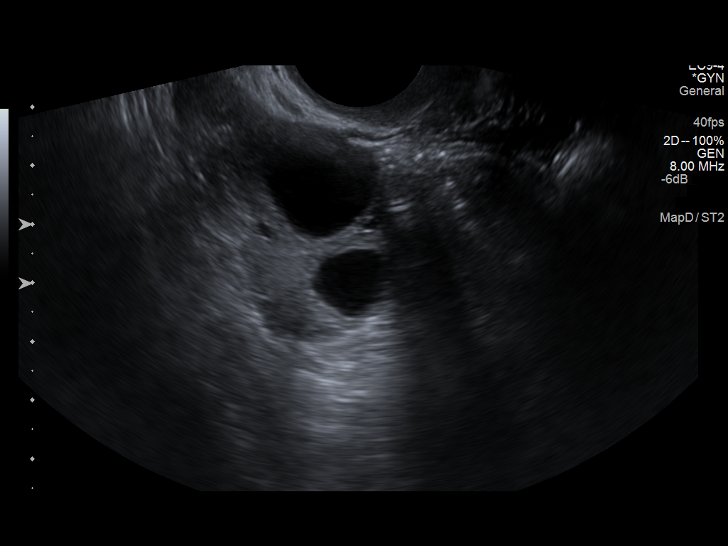
[im 58/58]
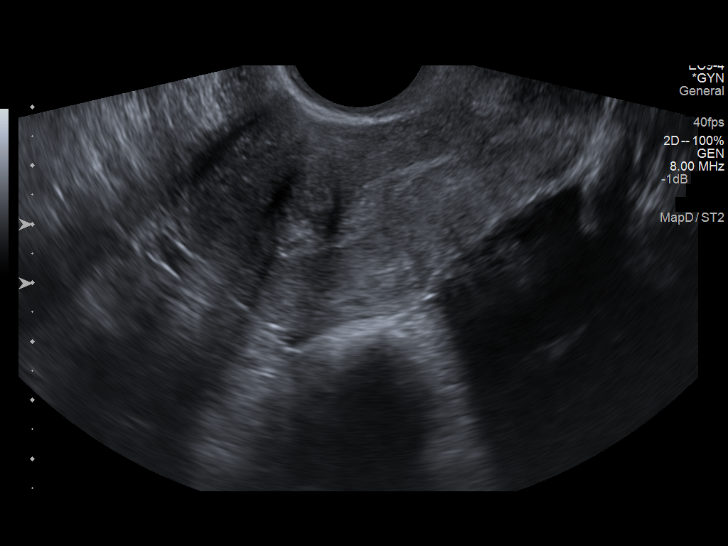

[14 of 25 positions shown; findings below may reference images not displayed]

FINDINGS: Uterus

Measurements: 8 x 4 x 5 cm. There is a IUD, with crossbar within 5
mm of the upper fundic endometrial canal. No evidence of myometrial
perforation. There is a submucosal fibroid in the anterior body,
x 1.2 x 1.5 cm. The fibroid has typical heterogeneous echotexture
with shadowing.

Endometrium

Thickness: 7 mm.  IUD as above.

Right ovary

Measurements: 4.4 x 3.3 x 2.8 cm. Size differential related to a
cm cyst/ follicle which appears simple.

Left ovary

Measurements: 2.8 x 2.3 x 2 cm.. Normal appearance/no adnexal mass.
IMPRESSION: 1. No acute pelvic findings.
2. IUD in unremarkable position.
3. 1.5 cm submucosal fibroid
